# Patient Record
Sex: Female | Born: 1935 | Race: White | Hispanic: No | Smoking: Never smoker
Health system: Southern US, Community
[De-identification: ages and names within clinical notes are randomized; demographics above are authoritative.]

## PROBLEM LIST (undated history)

## (undated) DIAGNOSIS — F329 Major depressive disorder, single episode, unspecified: Secondary | ICD-10-CM

## (undated) DIAGNOSIS — K219 Gastro-esophageal reflux disease without esophagitis: Secondary | ICD-10-CM

## (undated) DIAGNOSIS — F419 Anxiety disorder, unspecified: Secondary | ICD-10-CM

## (undated) DIAGNOSIS — G4700 Insomnia, unspecified: Secondary | ICD-10-CM

## (undated) DIAGNOSIS — A419 Sepsis, unspecified organism: Secondary | ICD-10-CM

## (undated) DIAGNOSIS — R339 Retention of urine, unspecified: Secondary | ICD-10-CM

## (undated) DIAGNOSIS — R2681 Unsteadiness on feet: Secondary | ICD-10-CM

## (undated) DIAGNOSIS — F32A Depression, unspecified: Secondary | ICD-10-CM

## (undated) DIAGNOSIS — E559 Vitamin D deficiency, unspecified: Secondary | ICD-10-CM

## (undated) DIAGNOSIS — G3184 Mild cognitive impairment, so stated: Secondary | ICD-10-CM

## (undated) DIAGNOSIS — E039 Hypothyroidism, unspecified: Secondary | ICD-10-CM

## (undated) HISTORY — DX: Retention of urine, unspecified: R33.9

## (undated) HISTORY — DX: Depression, unspecified: F32.A

## (undated) HISTORY — DX: Insomnia, unspecified: G47.00

## (undated) HISTORY — DX: Gastro-esophageal reflux disease without esophagitis: K21.9

## (undated) HISTORY — DX: Unsteadiness on feet: R26.81

## (undated) HISTORY — DX: Vitamin D deficiency, unspecified: E55.9

## (undated) HISTORY — DX: Mild cognitive impairment of uncertain or unknown etiology: G31.84

## (undated) HISTORY — DX: Anxiety disorder, unspecified: F41.9

## (undated) HISTORY — DX: Major depressive disorder, single episode, unspecified: F32.9

## (undated) HISTORY — DX: Hypothyroidism, unspecified: E03.9

## (undated) HISTORY — DX: Sepsis, unspecified organism: A41.9

---

## 1979-01-23 HISTORY — PX: ABDOMINAL HYSTERECTOMY: SHX81

## 2013-07-05 ENCOUNTER — Other Ambulatory Visit: Payer: Self-pay | Admitting: *Deleted

## 2013-07-05 MED ORDER — CLONAZEPAM 1 MG PO TABS: ORAL_TABLET | ORAL | Status: DC

## 2013-07-05 NOTE — Telephone Encounter (Signed)
[facility]

## 2013-07-09 ENCOUNTER — Encounter: Payer: Self-pay | Admitting: Internal Medicine

## 2013-07-09 ENCOUNTER — Non-Acute Institutional Stay (SKILLED_NURSING_FACILITY): Payer: Medicare Other | Admitting: Internal Medicine

## 2013-07-09 DIAGNOSIS — K219 Gastro-esophageal reflux disease without esophagitis: Secondary | ICD-10-CM | POA: Insufficient documentation

## 2013-07-09 DIAGNOSIS — R2681 Unsteadiness on feet: Secondary | ICD-10-CM | POA: Insufficient documentation

## 2013-07-09 DIAGNOSIS — F3289 Other specified depressive episodes: Secondary | ICD-10-CM

## 2013-07-09 DIAGNOSIS — E559 Vitamin D deficiency, unspecified: Secondary | ICD-10-CM | POA: Insufficient documentation

## 2013-07-09 DIAGNOSIS — E039 Hypothyroidism, unspecified: Secondary | ICD-10-CM | POA: Insufficient documentation

## 2013-07-09 DIAGNOSIS — F411 Generalized anxiety disorder: Secondary | ICD-10-CM

## 2013-07-09 DIAGNOSIS — R339 Retention of urine, unspecified: Secondary | ICD-10-CM | POA: Insufficient documentation

## 2013-07-09 DIAGNOSIS — F329 Major depressive disorder, single episode, unspecified: Secondary | ICD-10-CM | POA: Insufficient documentation

## 2013-07-09 DIAGNOSIS — F32A Depression, unspecified: Secondary | ICD-10-CM | POA: Insufficient documentation

## 2013-07-09 DIAGNOSIS — G3184 Mild cognitive impairment, so stated: Secondary | ICD-10-CM

## 2013-07-09 DIAGNOSIS — R269 Unspecified abnormalities of gait and mobility: Secondary | ICD-10-CM

## 2013-07-09 DIAGNOSIS — F419 Anxiety disorder, unspecified: Secondary | ICD-10-CM | POA: Insufficient documentation

## 2013-07-09 DIAGNOSIS — R0902 Hypoxemia: Secondary | ICD-10-CM

## 2013-07-09 DIAGNOSIS — N390 Urinary tract infection, site not specified: Secondary | ICD-10-CM

## 2013-07-09 NOTE — Assessment & Plan Note (Signed)
On chronic Klonopin, felt to be part of the gait problem;will try to wean

## 2013-07-09 NOTE — Assessment & Plan Note (Signed)
Chronic issue being w/u as out pt, slowed down by recent MSSA sepsis and UTI; meds to have her normopressure hydrocephalus work up completed;tamulosin and rapaflo were d/c;wanted to d/c Klonopin but pt made a fuss; admitted OT/PT

## 2013-07-09 NOTE — Assessment & Plan Note (Signed)
D/c with 2 days of cipro to complete 7 days total

## 2013-07-09 NOTE — Assessment & Plan Note (Signed)
Etology? H and P mentions possible viral bronchits and rec nebs but d/c summary says nothing about that; think it may be OSA with rec for outpt sleep study; resolved

## 2013-07-09 NOTE — Assessment & Plan Note (Signed)
Continue effexor  

## 2013-07-09 NOTE — Assessment & Plan Note (Signed)
Unsure etiology;felt had capacity to make own decisions;pt was on sinemet for unclear reason

## 2013-07-09 NOTE — Progress Notes (Signed)
MRN: [id number] Name: Evelyn Robinson  Sex: female Age: 80 y.o. DOB: 1932-12-30  PSC #: [facility] Facility/Room: 223A Level Of Care: SNF Provider: Merrilee Seashore D Emergency Contacts: No emergency contact information on file.  Code Status: FULL  Allergies: Review of patient's allergies indicates no known allergies.  Chief Complaint  Patient presents with  . nursing home admission    HPI: Patient is 80 y.o. female who is being admitted for gait instability requiring 24 hour supervision and OT/PT.  Past Medical History  Diagnosis Date  . Anxiety   . Depression   . GERD (gastroesophageal reflux disease)   . Vitamin D deficiency disease   . Hypothyroid   . Urinary retention   . Insomnia   . Mild cognitive impairment   . Gait instability     Past Surgical History  Procedure Laterality Date  . Abdominal hysterectomy  1980's      Medication List       This list is accurate as of: 07/09/13  9:43 PM.  Always use your most recent med list.               carbidopa-levodopa 25-100 MG per tablet  Commonly known as:  SINEMET IR  Take 1 tablet by mouth 2 (two) times daily.     clonazePAM 1 MG tablet  Commonly known as:  KLONOPIN  Take one and 1/2 tablet by mouth at bedtime     levothyroxine 100 MCG tablet  Commonly known as:  SYNTHROID, LEVOTHROID  Take 100 mcg by mouth daily before breakfast.     omeprazole 20 MG capsule  Commonly known as:  PRILOSEC  Take 20 mg by mouth 2 (two) times daily before a meal.     polyethylene glycol packet  Commonly known as:  MIRALAX / GLYCOLAX  Take 17 g by mouth 2 (two) times daily as needed.     venlafaxine XR 37.5 MG 24 hr capsule  Commonly known as:  EFFEXOR-XR  Take 37.5 mg by mouth daily with breakfast.        Meds ordered this encounter  Medications  . carbidopa-levodopa (SINEMET IR) 25-100 MG per tablet    Sig: Take 1 tablet by mouth 2 (two) times daily.  Marland Kitchen levothyroxine (SYNTHROID, LEVOTHROID) 100 MCG tablet     Sig: Take 100 mcg by mouth daily before breakfast.  . omeprazole (PRILOSEC) 20 MG capsule    Sig: Take 20 mg by mouth 2 (two) times daily before a meal.  . polyethylene glycol (MIRALAX / GLYCOLAX) packet    Sig: Take 17 g by mouth 2 (two) times daily as needed.  . venlafaxine XR (EFFEXOR-XR) 37.5 MG 24 hr capsule    Sig: Take 37.5 mg by mouth daily with breakfast.     There is no immunization history on file for this patient.  History  Substance Use Topics  . Smoking status: Unknown If Ever Smoked  . Smokeless tobacco: Not on file  . Alcohol Use: Not on file    Family history is noncontributory    Review of Systems  DATA OBTAINED: from patient GENERAL: Feels well no fevers, fatigue, appetite changes SKIN: No itching, rash or wounds EYES: No eye pain, redness, discharge EARS: No earache, tinnitus, change in hearing NOSE: No congestion, drainage or bleeding  MOUTH/THROAT: No mouth or tooth pain, No sore throat, No difficulty chewing or swallowing  RESPIRATORY: No cough, wheezing, SOB CARDIAC: No chest pain, palpitations, lower extremity edema  GI: No abdominal pain, No  N/V/D or constipation, No heartburn or reflux  GU: No dysuria, frequency or urgency, or incontinence  MUSCULOSKELETAL: No unrelieved bone/joint pain NEUROLOGIC: No headache, dizziness or focal weakness PSYCHIATRIC: No overt anxiety or sadness. Sleeps well. No behavior issue.   Filed Vitals:   07/09/13 2055  BP: 150/88  Pulse: 71  Temp: 97.6 F (36.4 C)  Resp: 18    Physical Exam  GENERAL APPEARANCE: Alert, conversant. Appropriately groomed. No acute distress.  SKIN: No diaphoresis HEAD: Normocephalic, atraumatic  EYES: Conjunctiva/lids clear. Pupils round, reactive. EOMs intact.  EARS: External exam WNL, canals clear. Hearing grossly normal.  NOSE: No deformity or discharge.  MOUTH/THROAT: Lips w/o lesions.  RESPIRATORY: Breathing is even, unlabored. Lung sounds are clear   CARDIOVASCULAR:  Heart RRR no murmurs, rubs or gallops. No peripheral edema.   GASTROINTESTINAL: Abdomen is soft, non-tender, not distended w/ normal bowel sounds. GENITOURINARY: Bladder non tender, not distended  MUSCULOSKELETAL: No abnormal joints or musculature NEUROLOGIC: Oriented X3. Cranial nerves 2-12 grossly intact. Moves all extremities no tremor. PSYCHIATRIC: Mood and affect appropriate to situation, no behavioral issues  Patient Active Problem List   Diagnosis Date Noted  . UTI (urinary tract infection) 07/09/2013  . Hypoxia 07/09/2013  . Anxiety   . Depression   . GERD (gastroesophageal reflux disease)   . Vitamin D deficiency disease   . Hypothyroid   . Urinary retention   . Gait instability   . Mild cognitive impairment        Assessment and Plan  Gait instability Chronic issue being w/u as out pt, slowed down by recent MSSA sepsis and UTI; meds to have her normopressure hydrocephalus work up completed;tamulosin and rapaflo were d/c;wanted to d/c Klonopin but pt made a fuss; admitted OT/PT  UTI (urinary tract infection) D/c with 2 days of cipro to complete 7 days total  Mild cognitive impairment Unsure etiology;felt had capacity to make own decisions;pt was on sinemet for unclear reason  Depression Continue effexor  Anxiety On chronic Klonopin, felt to be part of the gait problem;will try to wean  Hypoxia Etology? H and P mentions possible viral bronchits and rec nebs but d/c summary says nothing about that; think it may be OSA with rec for outpt sleep study; resolved    Margit HanksALEXANDER, Shaquavia Whisonant D, MD

## 2013-07-20 ENCOUNTER — Non-Acute Institutional Stay (SKILLED_NURSING_FACILITY): Payer: Medicare Other | Admitting: Nurse Practitioner

## 2013-07-20 DIAGNOSIS — R269 Unspecified abnormalities of gait and mobility: Secondary | ICD-10-CM

## 2013-07-20 DIAGNOSIS — E039 Hypothyroidism, unspecified: Secondary | ICD-10-CM

## 2013-07-20 DIAGNOSIS — R0902 Hypoxemia: Secondary | ICD-10-CM

## 2013-07-20 DIAGNOSIS — K219 Gastro-esophageal reflux disease without esophagitis: Secondary | ICD-10-CM

## 2013-07-20 DIAGNOSIS — N390 Urinary tract infection, site not specified: Secondary | ICD-10-CM

## 2013-07-20 DIAGNOSIS — F411 Generalized anxiety disorder: Secondary | ICD-10-CM

## 2013-07-20 DIAGNOSIS — R2681 Unsteadiness on feet: Secondary | ICD-10-CM

## 2013-07-20 DIAGNOSIS — F419 Anxiety disorder, unspecified: Secondary | ICD-10-CM

## 2013-07-20 NOTE — Progress Notes (Signed)
Patient ID: Evelyn Robinson, female   DOB: 10-18-1932, 80 y.o.   MRN: [id number]    Nursing Home Location:  [facility]   Place of Service: SNF (31)  PCP: No primary provider on file.  No Known Allergies  Chief Complaint  Patient presents with  . Discharge Note    HPI:  80 year old female who [facility] hospitalization for UTI, hypoxia, gait instability for rehab. Pt with pmh of anxiety depression, GERD, hypothyroidism. Pt was worked up in the hospital due to frequent falls but still needs to have ongoing evaluation for normal pressure hydrocephalus per hospital discharge report. Patient currently doing well with therapy, now stable to discharge home with home health.  Review of Systems:  Review of Systems  Constitutional: Negative for fever, chills and malaise/fatigue.  Eyes: Negative.   Respiratory: Negative for cough and shortness of breath.   Cardiovascular: Negative for chest pain and palpitations.  Gastrointestinal: Positive for constipation. Negative for heartburn, abdominal pain and diarrhea.  Genitourinary: Negative for dysuria and urgency.  Musculoskeletal: Negative for falls, joint pain and myalgias.  Skin: Negative.   Neurological: Negative for dizziness, sensory change, focal weakness, weakness and headaches.  Psychiatric/Behavioral: Negative for depression. The patient is not nervous/anxious and does not have insomnia.      Past Medical History  Diagnosis Date  . Anxiety   . Depression   . GERD (gastroesophageal reflux disease)   . Vitamin D deficiency disease   . Hypothyroid   . Urinary retention   . Insomnia   . Mild cognitive impairment   . Gait instability    Past Surgical History  Procedure Laterality Date  . Abdominal hysterectomy  1980's   Social History:   has no tobacco, alcohol, and drug history on file.  No family history on file.  Medications: Patient's Medications  New Prescriptions   No  medications on file  Previous Medications   CARBIDOPA-LEVODOPA (SINEMET IR) 25-100 MG PER TABLET    Take 1 tablet by mouth 2 (two) times daily.   CLONAZEPAM (KLONOPIN) 1 MG TABLET    Take one and 1/2 tablet by mouth at bedtime   LEVOTHYROXINE (SYNTHROID, LEVOTHROID) 100 MCG TABLET    Take 100 mcg by mouth daily before breakfast.   OMEPRAZOLE (PRILOSEC) 20 MG CAPSULE    Take 20 mg by mouth 2 (two) times daily before a meal.   POLYETHYLENE GLYCOL (MIRALAX / GLYCOLAX) PACKET    Take 17 g by mouth 2 (two) times daily as needed.   VENLAFAXINE XR (EFFEXOR-XR) 37.5 MG 24 HR CAPSULE    Take 37.5 mg by mouth daily with breakfast.  Modified Medications   No medications on file  Discontinued Medications   No medications on file     Physical Exam:  Filed Vitals:   07/20/13 1410  BP: 139/80  Pulse: 76  Temp: 97.4 F (36.3 C)  Resp: 20  SpO2: 97%   Physical Exam  Constitutional: She is oriented to person, place, and time and well-developed, well-nourished, and in no distress.  HENT:  Head: Normocephalic and atraumatic.  Mouth/Throat: Oropharynx is clear and moist. No oropharyngeal exudate.  Eyes: Conjunctivae and EOM are normal. Pupils are equal, round, and reactive to light.  Neck: Normal range of motion. Neck supple.  Cardiovascular: Normal rate, regular rhythm and normal heart sounds.   Pulmonary/Chest: Effort normal and breath sounds normal. No respiratory distress.  Abdominal: Soft. Bowel sounds are normal. She exhibits no  distension.  Musculoskeletal: Normal range of motion. She exhibits no edema and no tenderness.  Neurological: She is alert and oriented to person, place, and time. Gait normal.  Skin: Skin is warm and dry.  Psychiatric: Affect normal.       Assessment/Plan 1. Hypoxia -this has resolved; pt was thought to have OSA but needs outpt follow up  2. GERD (gastroesophageal reflux disease) -without complaints on omeprazole 20 mg BID   3. Hypothyroid -on  levothroxine daily; no TSH on file, will need to be followed by outpt PCP  4. UTI (urinary tract infection) -resolved, finished Cipro; no symptoms   5. Gait instability -multiple falls at home, has done well at [facility] pt is stable for discharge-will need PT/OT per home health. No DME needed. Rx written.  will need to follow up with PCP within 2 weeks.  -will need ongoing follow up with neurology  For normal pressure hydrocephalus work up which was not completed in hospital   6. Anxiety -currently stable on Effexor and klonopin   7.Constipation - Will have occasional constipation -recommended colace 100 mg daily and to increase water intake

## 2013-08-16 ENCOUNTER — Ambulatory Visit: Payer: Medicare Other | Admitting: Nurse Practitioner

## 2013-08-21 DIAGNOSIS — R269 Unspecified abnormalities of gait and mobility: Secondary | ICD-10-CM

## 2013-08-21 DIAGNOSIS — Z5189 Encounter for other specified aftercare: Secondary | ICD-10-CM

## 2013-08-21 DIAGNOSIS — N390 Urinary tract infection, site not specified: Secondary | ICD-10-CM

## 2013-09-06 ENCOUNTER — Ambulatory Visit (INDEPENDENT_AMBULATORY_CARE_PROVIDER_SITE_OTHER): Payer: Medicare Other | Admitting: Nurse Practitioner

## 2013-09-06 ENCOUNTER — Encounter: Payer: Self-pay | Admitting: Nurse Practitioner

## 2013-09-06 VITALS — BP 120/64 | HR 68 | Temp 97.8°F | Ht 66.0 in | Wt 205.0 lb

## 2013-09-06 DIAGNOSIS — R0902 Hypoxemia: Secondary | ICD-10-CM

## 2013-09-06 DIAGNOSIS — K5900 Constipation, unspecified: Secondary | ICD-10-CM | POA: Insufficient documentation

## 2013-09-06 DIAGNOSIS — K219 Gastro-esophageal reflux disease without esophagitis: Secondary | ICD-10-CM

## 2013-09-06 DIAGNOSIS — F419 Anxiety disorder, unspecified: Secondary | ICD-10-CM

## 2013-09-06 DIAGNOSIS — G912 (Idiopathic) normal pressure hydrocephalus: Secondary | ICD-10-CM

## 2013-09-06 DIAGNOSIS — E039 Hypothyroidism, unspecified: Secondary | ICD-10-CM

## 2013-09-06 DIAGNOSIS — F411 Generalized anxiety disorder: Secondary | ICD-10-CM

## 2013-09-06 NOTE — Patient Instructions (Addendum)
May decrease omeprazole to once daily if not having any acid reflux-- if this gets worse can increase back to twice daily  Will have you come back in 4 weeks for extended visit with fasting blood work prior to visit   Referrals done for neurology and pulmonary -please call and have your records sent to neurologist  For arm pain May use heat Aleve twice daily for 1 week Will make Occupational therapy aware to help

## 2013-09-06 NOTE — Progress Notes (Signed)
Patient ID: Evelyn Robinson, female   DOB: Feb 15, 1933, 80 y.o.   MRN: [id number]    No Known Allergies  Chief Complaint  Patient presents with  . Establish Care    NP-Establish Care  . other    recovering from sepsis & weakness is getting better, Rt shoulder pain from falling during this time.  . Immunizations    waiting on old medical records    HPI: Patient is a 80 y.o. female seen in the office today to establish care, [place] and was [place] there then transferred up to [place] to live close to her daughter, living at [address].  Was in the process of being evaluated for NPH by neurology (unsure of who this was) but was hospitalized before workup was done  [facility], going well.  -when she was at home in [place] she feel on her left shoulder, soreness is still there after 3-4 months, never had it evaluated when she was in [place]. Pain from elbow to the shoulder. Decreased strength and pain when she tries to use arm Had original injury months ago then another episodes where she fell 3 weeks ago   Review of Systems:  Review of Systems  Constitutional: Positive for malaise/fatigue (baseline has decreae energy). Negative for weight loss.  HENT: Positive for congestion (non allergic sinusitis- chronic no medication). Negative for hearing loss and tinnitus.   Eyes:       Wears corrective lens  Respiratory: Negative for cough, sputum production and shortness of breath.   Cardiovascular: Negative for chest pain and leg swelling.  Gastrointestinal: Negative for heartburn, diarrhea and constipation.  Genitourinary: Negative for dysuria, urgency and frequency.       Incontinence of urine  Musculoskeletal: Positive for joint pain (right shoulder) and myalgias (right upper arm).  Neurological: Negative for dizziness, tingling, tremors, focal weakness, weakness and headaches.  Psychiatric/Behavioral: Positive for depression and memory  loss (neurologist evaluated). The patient is nervous/anxious and has insomnia (medication helps).      Past Medical History  Diagnosis Date  . Anxiety   . Depression   . GERD (gastroesophageal reflux disease)   . Vitamin D deficiency disease   . Hypothyroid   . Urinary retention   . Insomnia   . Mild cognitive impairment   . Gait instability   . Sepsis    Past Surgical History  Procedure Laterality Date  . Abdominal hysterectomy  1980's   Social History:   reports that she has never smoked. She does not have any smokeless tobacco history on file. She reports that she drinks alcohol. She reports that she does not use illicit drugs.  History reviewed. No pertinent family history.  Medications: Patient's Medications  New Prescriptions   No medications on file  Previous Medications   CARBIDOPA-LEVODOPA (SINEMET IR) 25-100 MG PER TABLET    Take 1 tablet by mouth 2 (two) times daily.   CLONAZEPAM (KLONOPIN) 1 MG TABLET    Take one and 1/2 tablet by mouth at bedtime   LEVOTHYROXINE (SYNTHROID, LEVOTHROID) 100 MCG TABLET    Take 100 mcg by mouth daily before breakfast. For Hypothyroidism   OMEPRAZOLE (PRILOSEC) 20 MG CAPSULE    Take 20 mg by mouth 2 (two) times daily before a meal.   POLYETHYLENE GLYCOL (MIRALAX / GLYCOLAX) PACKET    Take 17 g by mouth 2 (two) times daily as needed. For constipation   VENLAFAXINE XR (EFFEXOR-XR) 37.5 MG 24 HR CAPSULE  Take 37.5 mg by mouth daily with breakfast.  Modified Medications   No medications on file  Discontinued Medications   No medications on file     Physical Exam:  Filed Vitals:   09/06/13 1358  BP: 120/64  Pulse: 68  Temp: 97.8 F (36.6 C)  TempSrc: Oral  Height: 5\' 6"  (1.676 m)  Weight: 205 lb (92.987 kg)  SpO2: 95%   Physical Exam  Constitutional: She is oriented to person, place, and time and well-developed, well-nourished, and in no distress.  HENT:  Head: Normocephalic and atraumatic.  Mouth/Throat: Oropharynx  is clear and moist. No oropharyngeal exudate.  Eyes: Conjunctivae and EOM are normal. Pupils are equal, round, and reactive to light.  Neck: Normal range of motion. Neck supple.  Cardiovascular: Normal rate, regular rhythm and normal heart sounds.   Pulmonary/Chest: Effort normal and breath sounds normal. No respiratory distress.  Abdominal: Soft. Bowel sounds are normal. She exhibits no distension.  Musculoskeletal: Normal range of motion. She exhibits no edema and no tenderness.  Neurological: She is alert and oriented to person, place, and time.  Skin: Skin is warm and dry.  Psychiatric: Affect normal.     Assessment/Plan  1. Hypothyroid -conts on synthroid, no recent TSH - TSH; before next appt  2. GERD (gastroesophageal reflux disease) -stable on prolosec, may decrease down to once daily since not having symptoms   3. Normal pressure hydrocephalus -had started work up [place], will have daughter get pts records transferred to local neurology office as well  - Ambulatory referral to Neurology - CBC With differential/Platelet; Future - Lipid panel; Future - Comprehensive metabolic panel; Future  4. Anxiety -remains stable on effexor and klonopin   5. Unspecified constipation -improved since at home  6. Hypoxia -noted in recent hospitalization during sleep, outpt sleep apnea follow up was recommended  - Ambulatory referral to Pulmonology  7. Right shoulder/arm pain -normal strength and ROM on exam; pt and daughter instructed she may use heat as needed, aleve twice daily for 1 week for inflammation, and will make Occupational therapy aware to evaluate and treat   Follow up in 4 weeks for EV with fasting blood work prior to visit, will get MMSE

## 2013-09-13 ENCOUNTER — Other Ambulatory Visit: Payer: Self-pay | Admitting: Nurse Practitioner

## 2013-09-18 ENCOUNTER — Ambulatory Visit: Payer: Medicare Other | Admitting: Diagnostic Neuroimaging

## 2013-09-20 DIAGNOSIS — F329 Major depressive disorder, single episode, unspecified: Secondary | ICD-10-CM | POA: Insufficient documentation

## 2013-09-20 DIAGNOSIS — R262 Difficulty in walking, not elsewhere classified: Secondary | ICD-10-CM | POA: Insufficient documentation

## 2013-09-20 DIAGNOSIS — K219 Gastro-esophageal reflux disease without esophagitis: Secondary | ICD-10-CM | POA: Insufficient documentation

## 2013-09-20 DIAGNOSIS — Z8669 Personal history of other diseases of the nervous system and sense organs: Secondary | ICD-10-CM | POA: Insufficient documentation

## 2013-09-20 DIAGNOSIS — R42 Dizziness and giddiness: Secondary | ICD-10-CM | POA: Insufficient documentation

## 2013-09-20 DIAGNOSIS — F411 Generalized anxiety disorder: Secondary | ICD-10-CM | POA: Insufficient documentation

## 2013-09-20 DIAGNOSIS — F3289 Other specified depressive episodes: Secondary | ICD-10-CM | POA: Insufficient documentation

## 2013-09-20 DIAGNOSIS — N390 Urinary tract infection, site not specified: Secondary | ICD-10-CM | POA: Insufficient documentation

## 2013-09-20 DIAGNOSIS — E039 Hypothyroidism, unspecified: Secondary | ICD-10-CM | POA: Insufficient documentation

## 2013-09-20 DIAGNOSIS — Z79899 Other long term (current) drug therapy: Secondary | ICD-10-CM | POA: Insufficient documentation

## 2013-09-21 ENCOUNTER — Encounter (HOSPITAL_COMMUNITY): Payer: Self-pay | Admitting: Emergency Medicine

## 2013-09-21 ENCOUNTER — Emergency Department (HOSPITAL_COMMUNITY): Payer: Medicare Other

## 2013-09-21 ENCOUNTER — Emergency Department (HOSPITAL_COMMUNITY)
Admission: EM | Admit: 2013-09-21 | Discharge: 2013-09-21 | Disposition: A | Discharge status: Home / Self Care | Payer: Medicare Other | Attending: Emergency Medicine | Admitting: Emergency Medicine

## 2013-09-21 DIAGNOSIS — R42 Dizziness and giddiness: Secondary | ICD-10-CM

## 2013-09-21 DIAGNOSIS — N390 Urinary tract infection, site not specified: Secondary | ICD-10-CM

## 2013-09-21 LAB — URINALYSIS, ROUTINE W REFLEX MICROSCOPIC
Bilirubin Urine: NEGATIVE
GLUCOSE, UA: NEGATIVE mg/dL
Hgb urine dipstick: NEGATIVE
KETONES UR: 15 mg/dL — AB
NITRITE: POSITIVE — AB
PROTEIN: NEGATIVE mg/dL
Specific Gravity, Urine: 1.029 (ref 1.005–1.030)
UROBILINOGEN UA: 0.2 mg/dL (ref 0.0–1.0)
pH: 6 (ref 5.0–8.0)

## 2013-09-21 LAB — CBC
HCT: 38.7 % (ref 36.0–46.0)
Hemoglobin: 12.8 g/dL (ref 12.0–15.0)
MCH: 29 pg (ref 26.0–34.0)
MCHC: 33.1 g/dL (ref 30.0–36.0)
MCV: 87.6 fL (ref 78.0–100.0)
Platelets: 318 10*3/uL (ref 150–400)
RBC: 4.42 MIL/uL (ref 3.87–5.11)
RDW: 15 % (ref 11.5–15.5)
WBC: 8.6 10*3/uL (ref 4.0–10.5)

## 2013-09-21 LAB — BASIC METABOLIC PANEL
BUN: 24 mg/dL — AB (ref 6–23)
CHLORIDE: 103 meq/L (ref 96–112)
CO2: 23 mEq/L (ref 19–32)
Calcium: 9.1 mg/dL (ref 8.4–10.5)
Creatinine, Ser: 0.71 mg/dL (ref 0.50–1.10)
GFR, EST NON AFRICAN AMERICAN: 80 mL/min — AB (ref >90)
Glucose, Bld: 132 mg/dL — ABNORMAL HIGH (ref 70–99)
POTASSIUM: 3.8 meq/L (ref 3.7–5.3)
SODIUM: 142 meq/L (ref 137–147)

## 2013-09-21 LAB — URINE MICROSCOPIC-ADD ON

## 2013-09-21 LAB — I-STAT CG4 LACTIC ACID, ED: LACTIC ACID, VENOUS: 1.3 mmol/L (ref 0.5–2.2)

## 2013-09-21 MED ORDER — SULFAMETHOXAZOLE-TMP DS 800-160 MG PO TABS
1.0000 | ORAL_TABLET | Freq: Once | ORAL | Status: AC
  Administered 2013-09-21: 1 via ORAL
  Filled 2013-09-21: qty 1

## 2013-09-21 MED ORDER — SULFAMETHOXAZOLE-TRIMETHOPRIM 800-160 MG PO TABS: 1.0000 | ORAL_TABLET | Freq: Two times a day (BID) | ORAL | Status: DC

## 2013-09-21 NOTE — ED Notes (Signed)
Patient arrives tonight with complaint of "stumbling" and dizziness when "turning head quickly". Was treated for sepsis in December and remembers feeling some of the same symptoms at that time. Has been in recovery since then. Currently has home health nurses that visit to help take care of her. Today nurse noticed that patient wasn't doing as well as normal with ambulation, standing, and balance.

## 2013-09-21 NOTE — ED Notes (Signed)
Returned from CT.

## 2013-09-21 NOTE — ED Provider Notes (Signed)
CSN: [id number]     Arrival date & time 09/20/13  2356 History   First MD Initiated Contact with Patient 09/21/13 0305     Chief Complaint  Patient presents with  . Difficulty Walking  . Dizziness     (Consider location/radiation/quality/duration/timing/severity/associated sxs/prior Treatment) HPI 80 year old female presents to emergency department from her assisted living facility with complaint of concern for sepsis.  Patient reports that she was diagnosed with sepsis in December, and afterwards developed problems with balance and and stability as well as generalized weakness.  Patient had a stay in a rehabilitation hospital and then a nursing facility, and is now moved up to a assisted living facility.  She has a physical therapist who comes twice a week to help her with strengthening and her balance.  Patient reports over the last 2-3 days she has felt slightly more dizzy in the morning when she wakes up especially after rolling over.  Symptoms last for several hours and are usually better in the afternoon.  Patient attributes her symptoms to taking extra Klonopin.  Patient reports dizziness feels like too much medication.  She denies presyncopal feeling.  Dizziness is a spinning sensation.  She reports today she has felt better as far as her dizziness is concerned.  Her physical therapist saw her today, and was concerned about her gait and dizziness.  The physical therapist told the patient that sometimes sepsis came back, which concerned the patient.  Patient denies any fever or chills no nausea vomiting or cough no urinary symptoms.  She is not quite sure what prompted her sepsis in December.  She is concerned as she was told that she progressed quickly to her sepsis in December that she may be developing sepsis and was told all was to come to the hospital immediately when she was feeling bad. Past Medical History  Diagnosis Date  . Anxiety   . Depression   . GERD (gastroesophageal reflux  disease)   . Vitamin D deficiency disease   . Hypothyroid   . Urinary retention   . Insomnia   . Mild cognitive impairment   . Gait instability   . Sepsis    Past Surgical History  Procedure Laterality Date  . Abdominal hysterectomy  1980's   History reviewed. No pertinent family history. History  Substance Use Topics  . Smoking status: Never Smoker   . Smokeless tobacco: Not on file  . Alcohol Use: Yes     Comment: maybe wine at christmas    OB History   Grav Para Term Preterm Abortions TAB SAB Ect Mult Living                 Review of Systems  See History of Present Illness; otherwise all other systems are reviewed and negative   Allergies  Review of patient's allergies indicates no known allergies.  Home Medications   Prior to Admission medications   Medication Sig Start Date End Date Taking? Authorizing Provider  carbidopa-levodopa (SINEMET IR) 25-100 MG per tablet TAKE ONE TABLET BY MOUTH TWICE DAILY 09/13/13   Claudie ReveringJessica M Karam, NP  clonazePAM (KLONOPIN) 1 MG tablet TAKE ONE & ONE-HALF TABLETS BY MOUTH AT BEDTIME 09/13/13   Claudie ReveringJessica M Karam, NP  levothyroxine (SYNTHROID, LEVOTHROID) 100 MCG tablet TAKE ONE TABLET BY MOUTH ONCE DAILY 09/13/13   Claudie ReveringJessica M Karam, NP  omeprazole (PRILOSEC) 20 MG capsule Take 1 capsule by mouth daily for Acid Reflex 09/13/13   Claudie ReveringJessica M Karam, NP  polyethylene glycol [facility](MIRALAX /  GLYCOLAX) packet Take 17 g by mouth 2 (two) times daily as needed. For constipation    Historical Provider, MD  venlafaxine XR (EFFEXOR-XR) 37.5 MG 24 hr capsule TAKE ONE CAPSULE BY MOUTH ONCE DAILY FOR ANXIETY/ DEPRESSION. 09/13/13   Claudie ReveringJessica M Karam, NP   BP 132/52  Pulse 73  Temp(Src) 97.6 F (36.4 C) (Oral)  Resp 18  Ht 5\' 7"  (1.702 m)  Wt 204 lb (92.534 kg)  BMI 31.94 kg/m2  SpO2 97% Physical Exam  Nursing note and vitals reviewed. Constitutional: She is oriented to person, place, and time. She appears well-developed and well-nourished.  HENT:  Head:  Normocephalic and atraumatic.  Right Ear: External ear normal.  Left Ear: External ear normal.  Nose: Nose normal.  Mouth/Throat: Oropharynx is clear and moist.  Eyes: Conjunctivae and EOM are normal. Pupils are equal, round, and reactive to light.  Neck: Normal range of motion. Neck supple. No JVD present. No tracheal deviation present. No thyromegaly present.  Cardiovascular: Normal rate, regular rhythm, normal heart sounds and intact distal pulses.  Exam reveals no gallop and no friction rub.   No murmur heard. Pulmonary/Chest: Effort normal and breath sounds normal. No stridor. No respiratory distress. She has no wheezes. She has no rales. She exhibits no tenderness.  Abdominal: Soft. Bowel sounds are normal. She exhibits no distension and no mass. There is no tenderness. There is no rebound and no guarding.  Musculoskeletal: Normal range of motion. She exhibits no edema and no tenderness.  Lymphadenopathy:    She has no cervical adenopathy.  Neurological: She is alert and oriented to person, place, and time. She has normal reflexes. No cranial nerve deficit. She exhibits normal muscle tone. Coordination (mild difficulty with heel shin and finger-nose finger) abnormal.  Skin: Skin is warm and dry. No rash noted. No erythema. No pallor.  Psychiatric: She has a normal mood and affect. Her behavior is normal. Judgment and thought content normal.    ED Course  Procedures (including critical care time) Labs Review Labs Reviewed  BASIC METABOLIC PANEL - Abnormal; Notable for the following:    Glucose, Bld 132 (*)    BUN 24 (*)    GFR calc non Af Amer 80 (*)    All other components within normal limits  URINALYSIS, ROUTINE W REFLEX MICROSCOPIC - Abnormal; Notable for the following:    APPearance TURBID (*)    Ketones, ur 15 (*)    Nitrite POSITIVE (*)    Leukocytes, UA MODERATE (*)    All other components within normal limits  URINE MICROSCOPIC-ADD ON - Abnormal; Notable for the  following:    Squamous Epithelial / LPF FEW (*)    Bacteria, UA MANY (*)    All other components within normal limits  CBC  I-STAT CG4 LACTIC ACID, ED    Imaging Review Ct Head Wo Contrast  09/21/2013   CLINICAL DATA:  Dizziness, headache  EXAM: CT HEAD WITHOUT CONTRAST  TECHNIQUE: Contiguous axial images were obtained from the base of the skull through the vertex without intravenous contrast.  COMPARISON:  None.  FINDINGS: Mild diffuse age-related atrophy noted. Scattered and confluent hypodensity within the periventricular and deep white matter of the cerebral hemispheres is consistent with chronic small vessel ischemic disease.  No acute intracranial hemorrhage or large vessel territory infarct identified. No mass or midline shift. There is no hydrocephalus. Mild asymmetric prominence of the left lateral ventricle as compared to the right noted. Basal ganglia calcifications noted. No extra-axial  fluid collection.  The calvarium is intact. Orbits are within normal limits. Scalp soft tissues are unremarkable.  Paranasal sinuses and mastoid air cells are well pneumatized and free of fluid.  IMPRESSION: 1. No acute intracranial abnormality. 2. Mild generalized cerebral atrophy with moderate chronic microvascular ischemic disease.   Electronically Signed   By: Rise Mu M.D.   On: 09/21/2013 05:21     EKG Interpretation None      MDM   Final diagnoses:  Dizziness  Urinary tract infection   80 year old female with acute on chronic dizziness, and concern for sepsis.  Patient's neuro exam is overall normal though she does have some problems with mild coordination, finger-nose-finger and heel shin slightly off.  There is documentation of normal pressure hydrocephalus, but no prior imaging in our system.  We'll start with a CAT scan of the head.  We'll get baseline labs.  Patient has close followup with the neurologist next week.  Symptoms have been present for 3 days.  The possibility of  the posterior cerebellar stroke is present, but less likely, and as symptoms have been present for 3 days and actually improving, not a candidate for TPA or aggressive inpatient rehabilitation.  Patient is more concerned about sepsis, no signs on physical exam, patient is mentating, normal vitals.  7:26 AM Patient noted to have UTI, will treat for same, have patient followup with neurology and her primary care Dr.  Olivia Mackie, MD 09/21/13 [phone]

## 2013-09-21 NOTE — ED Notes (Signed)
Patient transported to CT 

## 2013-09-21 NOTE — ED Notes (Signed)
MD at bedside. 

## 2013-09-21 NOTE — Discharge Instructions (Signed)
Your workup has shown NO SIGNS OF SEPSIS!  You do have a mild urinary tract infection, and should start the antibiotic prescribed.  Your ongoing dizziness is concerning, and you should avoid use of your klonopin as this might worsen your symptoms.  Drink plenty of water!  Keep your appointment with the neurologist next week. Return to the ER for worsening condition or new concerns.   Dizziness Dizziness is a common problem. It is a feeling of unsteadiness or lightheadedness. You may feel like you are about to faint. Dizziness can lead to injury if you stumble or fall. A person of any age group can suffer from dizziness, but dizziness is more common in older adults. CAUSES  Dizziness can be caused by many different things, including:  Middle ear problems.  Standing for too long.  Infections.  An allergic reaction.  Aging.  An emotional response to something, such as the sight of blood.  Side effects of medicines.  Fatigue.  Problems with circulation or blood pressure.  Excess use of alcohol, medicines, or illegal drug use.  Breathing too fast (hyperventilation).  An arrhythmia or problems with your heart rhythm.  Low red blood cell count (anemia).  Pregnancy.  Vomiting, diarrhea, fever, or other illnesses that cause dehydration.  Diseases or conditions such as Parkinson's disease, high blood pressure (hypertension), diabetes, and thyroid problems.  Exposure to extreme heat. DIAGNOSIS  To find the cause of your dizziness, your caregiver may do a physical exam, lab tests, radiologic imaging scans, or an electrocardiography test (ECG).  TREATMENT  Treatment of dizziness depends on the cause of your symptoms and can vary greatly. HOME CARE INSTRUCTIONS   Drink enough fluids to keep your urine clear or pale yellow. This is especially important in very hot weather. In the elderly, it is also important in cold weather.  If your dizziness is caused by medicines, take them  exactly as directed. When taking blood pressure medicines, it is especially important to get up slowly.  Rise slowly from chairs and steady yourself until you feel okay.  In the morning, first sit up on the side of the bed. When this seems okay, stand slowly while holding onto something until you know your balance is fine.  If you need to stand in one place for a long time, be sure to move your legs often. Tighten and relax the muscles in your legs while standing.  If dizziness continues to be a problem, have someone stay with you for a day or two. Do this until you feel you are well enough to stay alone. Have the person call your caregiver if he or she notices changes in you that are concerning.  Do not drive or use heavy machinery if you feel dizzy.  Do not drink alcohol. SEEK IMMEDIATE MEDICAL CARE IF:   Your dizziness or lightheadedness gets worse.  You feel nauseous or vomit.  You develop problems with talking, walking, weakness, or using your arms, hands, or legs.  You are not thinking clearly or you have difficulty forming sentences. It may take a friend or family member to determine if your thinking is normal.  You develop chest pain, abdominal pain, shortness of breath, or sweating.  Your vision changes.  You notice any bleeding.  You have side effects from medicine that seems to be getting worse rather than better. MAKE SURE YOU:   Understand these instructions.  Will watch your condition.  Will get help right away if you are not doing  well or get worse. Document Released: 11/03/2000 Document Revised: 08/02/2011 Document Reviewed: 11/27/2010 [facility] Patient Information 2014 [place], [place].   Urinary Tract Infection Urinary tract infections (UTIs) can develop anywhere along your urinary tract. Your urinary tract is your body's drainage system for removing wastes and extra water. Your urinary tract includes two kidneys, two ureters, a bladder, and a urethra. Your  kidneys are a pair of bean-shaped organs. Each kidney is about the size of your fist. They are located below your ribs, one on each side of your spine. CAUSES Infections are caused by microbes, which are microscopic organisms, including fungi, viruses, and bacteria. These organisms are so small that they can only be seen through a microscope. Bacteria are the microbes that most commonly cause UTIs. SYMPTOMS  Symptoms of UTIs may vary by age and gender of the patient and by the location of the infection. Symptoms in young women typically include a frequent and intense urge to urinate and a painful, burning feeling in the bladder or urethra during urination. Older women and men are more likely to be tired, shaky, and weak and have muscle aches and abdominal pain. A fever may mean the infection is in your kidneys. Other symptoms of a kidney infection include pain in your back or sides below the ribs, nausea, and vomiting. DIAGNOSIS To diagnose a UTI, your caregiver will ask you about your symptoms. Your caregiver also will ask to provide a urine sample. The urine sample will be tested for bacteria and white blood cells. White blood cells are made by your body to help fight infection. TREATMENT  Typically, UTIs can be treated with medication. Because most UTIs are caused by a bacterial infection, they usually can be treated with the use of antibiotics. The choice of antibiotic and length of treatment depend on your symptoms and the type of bacteria causing your infection. HOME CARE INSTRUCTIONS  If you were prescribed antibiotics, take them exactly as your caregiver instructs you. Finish the medication even if you feel better after you have only taken some of the medication.  Drink enough water and fluids to keep your urine clear or pale yellow.  Avoid caffeine, tea, and carbonated beverages. They tend to irritate your bladder.  Empty your bladder often. Avoid holding urine for long periods of  time.  Empty your bladder before and after sexual intercourse.  After a bowel movement, women should cleanse from front to back. Use each tissue only once. SEEK MEDICAL CARE IF:   You have back pain.  You develop a fever.  Your symptoms do not begin to resolve within 3 days. SEEK IMMEDIATE MEDICAL CARE IF:   You have severe back pain or lower abdominal pain.  You develop chills.  You have nausea or vomiting.  You have continued burning or discomfort with urination. MAKE SURE YOU:   Understand these instructions.  Will watch your condition.  Will get help right away if you are not doing well or get worse. Document Released: 02/17/2005 Document Revised: 11/09/2011 Document Reviewed: 06/18/2011 [facility]Exit[facility] Patient Information 2014 [place] CenterExitCare, [place].

## 2013-09-24 ENCOUNTER — Telehealth (HOSPITAL_BASED_OUTPATIENT_CLINIC_OR_DEPARTMENT_OTHER): Payer: Self-pay | Admitting: Emergency Medicine

## 2013-09-24 LAB — URINE CULTURE: Colony Count: >=100000

## 2013-09-24 NOTE — Telephone Encounter (Signed)
Post ED Visit - Positive Culture Follow-up  Culture report reviewed by antimicrobial stewardship pharmacist: []  Wes Dulaney, Pharm.D., BCPS [x]  Celedonio MiyamotoJeremy Frens, Pharm.D., BCPS []  Georgina PillionElizabeth Martin, [address]Pharm.D., BCPS []  Candlewick LakeMinh Pham, [place]Pharm.D., BCPS, AAHIVP []  Estella HuskMichelle Turner, Pharm.D., BCPS, AAHIVP []  Harvie JuniorNathan Cope, Pharm.D.  Positive urine culture Treated with Sulfa-Trimeth, organism sensitive to the same and no further patient follow-up is required at this time.  Zeb ComfortKylie Liah Morr 09/24/2013, 7:39 PM

## 2013-09-28 ENCOUNTER — Encounter: Payer: Self-pay | Admitting: Pulmonary Disease

## 2013-09-28 ENCOUNTER — Ambulatory Visit (INDEPENDENT_AMBULATORY_CARE_PROVIDER_SITE_OTHER): Payer: Medicare Other | Admitting: Pulmonary Disease

## 2013-09-28 VITALS — BP 106/68 | HR 78 | Temp 97.9°F | Ht 67.0 in | Wt 201.0 lb

## 2013-09-28 DIAGNOSIS — G4700 Insomnia, unspecified: Secondary | ICD-10-CM

## 2013-09-28 DIAGNOSIS — R2681 Unsteadiness on feet: Secondary | ICD-10-CM

## 2013-09-28 DIAGNOSIS — R269 Unspecified abnormalities of gait and mobility: Secondary | ICD-10-CM

## 2013-09-28 DIAGNOSIS — R0902 Hypoxemia: Secondary | ICD-10-CM

## 2013-09-28 DIAGNOSIS — G4733 Obstructive sleep apnea (adult) (pediatric): Secondary | ICD-10-CM

## 2013-09-28 NOTE — Assessment & Plan Note (Signed)
She reports trouble falling asleep, and uses klonopin for this.  From her symptom description her difficulties with sleep onset might be relate to sleep apnea.  Advised that she can use klonopin on the night of her sleep study if needed.

## 2013-09-28 NOTE — Patient Instructions (Signed)
Will arrange for sleep study Will call to arrange for follow up after sleep study reviewed 

## 2013-09-28 NOTE — Assessment & Plan Note (Signed)
She reports snoring, sleep disruption, apnea, and daytime sleepiness.  She was noted to have oxygen desaturation while asleep during recent hospitalization.  She has hx of depression and anxiety.  I am concerned she could have sleep apnea.  We discussed how sleep apnea can affect various health problems including risks for hypertension, cardiovascular disease, and diabetes.  We also discussed how sleep disruption can increase risks for accident, such as while driving.  Weight loss as a means of improving sleep apnea was also reviewed.  Additional treatment options discussed were CPAP therapy, oral appliance, and surgical intervention.  To further assess will arrange for in lab sleep study.

## 2013-09-28 NOTE — Assessment & Plan Note (Signed)
She thinks this is related to previous mini-stroke.  She is scheduled for evaluation with neurology.  Will defer to neurology about whether she needs to remain on sinemet.

## 2013-09-28 NOTE — Progress Notes (Signed)
   Subjective:    Patient ID: Evelyn Robinson, female    DOB: November 19, 1932, 80 y.o.   MRN: [id number]  HPI    Review of Systems  Constitutional: Negative for fever and unexpected weight change.  HENT: Positive for congestion. Negative for dental problem, ear pain, nosebleeds, postnasal drip, rhinorrhea, sinus pressure, sneezing, sore throat and trouble swallowing.   Eyes: Negative for redness and itching.  Respiratory: Negative for cough, chest tightness, shortness of breath and wheezing.   Cardiovascular: Negative for palpitations and leg swelling.  Gastrointestinal: Negative for nausea and vomiting.  Genitourinary: Negative for dysuria.  Musculoskeletal: Negative for joint swelling.  Skin: Negative for rash.  Neurological: Negative for headaches.  Hematological: Does not bruise/bleed easily.  Psychiatric/Behavioral: Negative for dysphoric mood. The patient is not nervous/anxious.        Objective:   Physical Exam        Assessment & Plan:

## 2013-09-28 NOTE — Progress Notes (Signed)
Chief Complaint  Patient presents with  . Sleep Consult    referred by Dr. Karam for sleep issues    History of Present Illness: Evelyn Robinson is a 80 y.o. female for evaluation of sleep problems.Evelyn Robinson  [place]  She was in hospital recently, and was noted to have low oxygen while asleep.  This raised concern for sleep apnea.  As a result she was referred to pulmonary/sleep medicine.  She has trouble falling asleep, and staying asleep.  She had [place] sleep study in [place] 2 years ago, but was never told what sleep study showed.  She does snore, and will get shallow breathing while asleep.  She has trouble sleeping on her back.  She feels like she has low energy level during the day.  She goes to sleep at 10 pm.  She falls asleep after hours of laying in bed.  She wakes up 2 to 3 times to use the bathroom.  She gets out of bed at 10 am.  She feels tired in the morning.  She denies morning headache.  She does not use anything to help her fall sleep or stay awake.  She denies sleep walking, sleep talking, bruxism, or nightmares.  There is no history of restless legs.  She denies sleep hallucinations, sleep paralysis, or cataplexy.  The Epworth score is 5 out of 24.   Evelyn Robinson  has a past medical history of Anxiety; Depression; GERD (gastroesophageal reflux disease); Vitamin D deficiency disease; Hypothyroid; Urinary retention; Insomnia; Mild cognitive impairment; Gait instability; and Sepsis.  Evelyn Robinson  has past surgical history that includes Abdominal hysterectomy (1980's).  Prior to Admission medications   Medication Sig Start Date End Date Taking? Authorizing Provider  carbidopa-levodopa (SINEMET IR) 25-100 MG per tablet TAKE ONE TABLET BY MOUTH TWICE DAILY 09/13/13   Claudie ReveringJessica M Karam, NP  clonazePAM (KLONOPIN) 1 MG tablet TAKE ONE & ONE-HALF TABLETS BY MOUTH AT BEDTIME 09/13/13   Claudie ReveringJessica M Karam, NP  levothyroxine  (SYNTHROID, LEVOTHROID) 100 MCG tablet TAKE ONE TABLET BY MOUTH ONCE DAILY 09/13/13   Claudie ReveringJessica M Karam, NP  omeprazole (PRILOSEC) 20 MG capsule Take 1 capsule by mouth daily for Acid Reflex 09/13/13   Claudie ReveringJessica M Karam, NP  polyethylene glycol (MIRALAX / GLYCOLAX) packet Take 17 g by mouth 2 (two) times daily as needed. For constipation    Historical Provider, MD  sulfamethoxazole-trimethoprim (SEPTRA DS) 800-160 MG per tablet Take 1 tablet by mouth every 12 (twelve) hours. 09/21/13   Olivia Mackielga M Otter, MD  venlafaxine XR (EFFEXOR-XR) 37.5 MG 24 hr capsule TAKE ONE CAPSULE BY MOUTH ONCE DAILY FOR ANXIETY/ DEPRESSION. 09/13/13   Claudie ReveringJessica M Karam, NP    No Known Allergies  Her family history is not on file.  She  reports that she has never smoked. She does not have any smokeless tobacco history on file. She reports that she drinks alcohol. She reports that she does not use illicit drugs.   Physical Exam:  General - No distress ENT - No sinus tenderness, no oral exudate, no LAN, no thyromegaly, TM clear, pupils equal/reactive, MP 3 Cardiac - s1s2 regular, no murmur, pulses symmetric Chest - No wheeze/rales/dullness, good air entry, normal respiratory excursion Back - No focal tenderness Abd - Soft, non-tender, no organomegaly, + bowel sounds Ext - No edema Neuro - Normal strength, cranial nerves intact Skin - No rashes Psych - Normal mood, and behavior  Assessment/plan:  Evelyn HellingVineet Joy Robinson, M.D. Pager [phone]

## 2013-10-01 ENCOUNTER — Other Ambulatory Visit: Payer: Medicare Other

## 2013-10-01 ENCOUNTER — Other Ambulatory Visit: Payer: Self-pay | Admitting: Nurse Practitioner

## 2013-10-03 ENCOUNTER — Encounter: Payer: Self-pay | Admitting: Nurse Practitioner

## 2013-10-03 ENCOUNTER — Ambulatory Visit (INDEPENDENT_AMBULATORY_CARE_PROVIDER_SITE_OTHER): Payer: Medicare Other | Admitting: Nurse Practitioner

## 2013-10-03 VITALS — BP 138/84 | HR 71 | Temp 98.0°F | Resp 10 | Ht 67.0 in | Wt 203.0 lb

## 2013-10-03 DIAGNOSIS — E782 Mixed hyperlipidemia: Secondary | ICD-10-CM

## 2013-10-03 DIAGNOSIS — E039 Hypothyroidism, unspecified: Secondary | ICD-10-CM

## 2013-10-03 DIAGNOSIS — K219 Gastro-esophageal reflux disease without esophagitis: Secondary | ICD-10-CM

## 2013-10-03 DIAGNOSIS — G912 (Idiopathic) normal pressure hydrocephalus: Secondary | ICD-10-CM

## 2013-10-03 DIAGNOSIS — R2681 Unsteadiness on feet: Secondary | ICD-10-CM

## 2013-10-03 DIAGNOSIS — R0902 Hypoxemia: Secondary | ICD-10-CM

## 2013-10-03 DIAGNOSIS — G3184 Mild cognitive impairment, so stated: Secondary | ICD-10-CM

## 2013-10-03 DIAGNOSIS — Z1231 Encounter for screening mammogram for malignant neoplasm of breast: Secondary | ICD-10-CM

## 2013-10-03 DIAGNOSIS — R269 Unspecified abnormalities of gait and mobility: Secondary | ICD-10-CM

## 2013-10-03 DIAGNOSIS — M25519 Pain in unspecified shoulder: Secondary | ICD-10-CM

## 2013-10-03 NOTE — Patient Instructions (Signed)
Please make sure your insurance will cover pneumonia vaccine   Will get follow up blood work today  Make appt to see Dr Renato Gailseed in 3 months for routine follow up

## 2013-10-03 NOTE — Progress Notes (Signed)
Patient ID: Evelyn MuckleLetty B Robinson, female   DOB: 17-Nov-1932, 80 y.o.   MRN: [id number]    No Known Allergies  Chief Complaint  Patient presents with  . Annual Exam    Yearly exam, no pap,labs completed on Monday, patient states she had an EKG within the last 3 months and it was normal   . Immunizations    Pneumonia vaccine to be given today   . Shoulder Pain    Still with sore shoulder, right side. Taking recommended meds   . FYI    Colonoscopy completed in [place] about 4 years ago, told 1 polyp     HPI: Patient is a 80 y.o. female seen in the office today for [facility]- reports she is not being worked hard enough- does not like therapist, no one has worked with her right shoulder- conts to take aleve and tylenol- shoulder is better  went to ED was found to have UTI- treated with bactrim- tolerated and finished treatment, no on-going symptoms Has seen Dr Craige CottaSood- sleep study scheduled Has not heard about neurology appt but daughter questions why she is on sinemet- could not find name of previous neurologist   Walking with walker and doing some without it, getting stronger Eating what [employer] gives her, eating well balanced diet  Colonoscopy- needs follow up in 2016 due to polyp mammogram - overdue dexa scan- over due- last 3-4 years ago PAP- had hysterectomy in the 80s   Review of Systems:  Review of Systems  Constitutional: Negative for weight loss and malaise/fatigue.  HENT: Negative for congestion, hearing loss and tinnitus.   Eyes:       Wears corrective lens  Respiratory: Negative for cough, sputum production and shortness of breath.   Cardiovascular: Negative for chest pain and leg swelling.  Gastrointestinal: Negative for heartburn, diarrhea and constipation.  Genitourinary: Negative for dysuria, urgency and frequency.       Incontinence of urine  Musculoskeletal: Positive for joint pain (right shoulder) and myalgias (improved).    Neurological: Negative for dizziness, tingling, tremors, focal weakness, weakness and headaches.  Psychiatric/Behavioral: Positive for memory loss (neurologist evaluated). Negative for depression. The patient is not nervous/anxious and does not have insomnia (medication helps).      Past Medical History  Diagnosis Date  . Anxiety   . Depression   . GERD (gastroesophageal reflux disease)   . Vitamin D deficiency disease   . Hypothyroid   . Urinary retention   . Insomnia   . Mild cognitive impairment   . Gait instability   . Sepsis    Past Surgical History  Procedure Laterality Date  . Abdominal hysterectomy  1980's   Social History:   reports that she has never smoked. She does not have any smokeless tobacco history on file. She reports that she drinks alcohol. She reports that she does not use illicit drugs.  History reviewed. No pertinent family history.  Medications: Patient's Medications  New Prescriptions   No medications on file  Previous Medications   CARBIDOPA-LEVODOPA (SINEMET IR) 25-100 MG PER TABLET    TAKE ONE TABLET BY MOUTH TWICE DAILY   CLONAZEPAM (KLONOPIN) 1 MG TABLET    TAKE ONE & ONE-HALF TABLETS BY MOUTH AT BEDTIME   LEVOTHYROXINE (SYNTHROID, LEVOTHROID) 100 MCG TABLET    TAKE ONE TABLET BY MOUTH ONCE DAILY   OMEPRAZOLE (PRILOSEC) 20 MG CAPSULE    Take 1 capsule by mouth daily for Acid Reflex   POLYETHYLENE  GLYCOL (MIRALAX / GLYCOLAX) PACKET    Take 17 g by mouth 2 (two) times daily as needed. For constipation   VENLAFAXINE XR (EFFEXOR-XR) 37.5 MG 24 HR CAPSULE    TAKE ONE CAPSULE BY MOUTH ONCE DAILY FOR ANXIETY/ DEPRESSION.  Modified Medications   No medications on file  Discontinued Medications   No medications on file     Physical Exam:  Filed Vitals:   10/03/13 1122  BP: 138/84  Pulse: 71  Temp: 98 F (36.7 C)  TempSrc: Oral  Resp: 10  Height: 5\' 7"  (1.702 m)  Weight: 203 lb (92.08 kg)  SpO2: 95%    Physical Exam  Constitutional:  She is oriented to person, place, and time and well-developed, well-nourished, and in no distress.  HENT:  Head: Normocephalic and atraumatic.  Right Ear: External ear normal.  Left Ear: External ear normal.  Nose: Nose normal.  Mouth/Throat: Oropharynx is clear and moist. No oropharyngeal exudate.  Eyes: Conjunctivae and EOM are normal. Pupils are equal, round, and reactive to light.  Neck: Normal range of motion. Neck supple.  Cardiovascular: Normal rate, regular rhythm, normal heart sounds and intact distal pulses.   Pulmonary/Chest: Effort normal and breath sounds normal. No respiratory distress.  Abdominal: Soft. Bowel sounds are normal. She exhibits no distension. There is no tenderness.  Musculoskeletal: Normal range of motion. She exhibits no edema and no tenderness.  Neurological: She is alert and oriented to person, place, and time.  Skin: Skin is warm and dry.  Psychiatric: Affect normal.     Labs reviewed: Basic Metabolic Panel:  Recent Labs  16/02/9604/01/15 0019  NA 142  K 3.8  CL 103  CO2 23  GLUCOSE 132*  BUN 24*  CREATININE 0.71  CALCIUM 9.1    Recent Labs  09/21/13 0019  WBC 8.6  HGB 12.8  HCT 38.7  MCV 87.6  PLT 318     Assessment/Plan  1. Elevated triglycerides with high cholesterol -heart healthy diet, to start fish oil daily - Hemoglobin A1c - Lipid panel will repeat per daughters request, she was not sure pt was fasting and would like it re-tested before starting medication   2. Normal pressure hydrocephalus -awaiting neurology referral   3. Hypoxia -conts to follow up with pulmonary  4. Mild cognitive impairment -MMSE 29/30 on todays screening  5. Gait instability -conts with PT, doing well with therapy gaining strength and able to walk better  6. Hypothyroid -conts on synthroid   7. GERD (gastroesophageal reflux disease) -does not wish to reduce medications since it is working -Sales promotion account executiveconts prilosec  8. Pain in joint, shoulder  region -overall is better, taking aleve and tylenol -instructed to stop aleve this was only short term, conts tylenol as needed - Ambulatory referral to Home Health  9. Other screening mammogram - MM DIGITAL SCREENING BILATERAL; Future  To follow up in 3 months

## 2013-10-03 NOTE — Progress Notes (Signed)
Passed clock drawing 

## 2013-10-04 ENCOUNTER — Other Ambulatory Visit: Payer: Self-pay | Admitting: Nurse Practitioner

## 2013-10-04 DIAGNOSIS — E785 Hyperlipidemia, unspecified: Secondary | ICD-10-CM

## 2013-10-04 LAB — HEMOGLOBIN A1C
Est. average glucose Bld gHb Est-mCnc: 134 mg/dL
HEMOGLOBIN A1C: 6.3 % — AB (ref 4.8–5.6)

## 2013-10-04 LAB — LIPID PANEL
Chol/HDL Ratio: 7.6 ratio units — ABNORMAL HIGH (ref 0.0–4.4)
Cholesterol, Total: 275 mg/dL — ABNORMAL HIGH (ref 100–199)
HDL: 36 mg/dL — AB (ref >39)
TRIGLYCERIDES: 447 mg/dL — AB (ref 0–149)

## 2013-10-04 MED ORDER — SIMVASTATIN 20 MG PO TABS: ORAL_TABLET | ORAL | Status: DC

## 2013-10-08 ENCOUNTER — Other Ambulatory Visit: Payer: Self-pay | Admitting: Nurse Practitioner

## 2013-10-22 ENCOUNTER — Encounter: Payer: Self-pay | Admitting: Nurse Practitioner

## 2013-10-24 ENCOUNTER — Other Ambulatory Visit: Payer: Self-pay | Admitting: *Deleted

## 2013-10-24 ENCOUNTER — Telehealth: Payer: Self-pay | Admitting: *Deleted

## 2013-10-24 NOTE — Telephone Encounter (Signed)
Patient daughter Notified and Appointment Scheduled.

## 2013-10-24 NOTE — Telephone Encounter (Signed)
Will not provide refill, needs appt for further discussion

## 2013-10-24 NOTE — Telephone Encounter (Signed)
Patient daughter called and stated that patient has increased her Clonazepam to TWO tablets at bedtime instead of 1 and 1/2. And now has ran out of medication. Daughter wants to know if this can be increased to 2/day and if we can call it in? Stated that this is working good. Please Advise.

## 2013-10-24 NOTE — Telephone Encounter (Signed)
LMOM to return call.

## 2013-10-25 ENCOUNTER — Encounter: Payer: Self-pay | Admitting: Nurse Practitioner

## 2013-10-25 ENCOUNTER — Ambulatory Visit (INDEPENDENT_AMBULATORY_CARE_PROVIDER_SITE_OTHER): Payer: Medicare Other | Admitting: Nurse Practitioner

## 2013-10-25 VITALS — BP 112/68 | HR 76 | Temp 98.1°F | Wt 204.0 lb

## 2013-10-25 DIAGNOSIS — G4700 Insomnia, unspecified: Secondary | ICD-10-CM

## 2013-10-25 MED ORDER — CLONAZEPAM 1 MG PO TABS: 1.0000 mg | ORAL_TABLET | Freq: Every day | ORAL | Status: DC

## 2013-10-25 NOTE — Patient Instructions (Signed)
Take 1 mg of clonazepam at night for 1 week then decrease to 1/2 tablet at night for 6 days then stop  May take melatonin 6 mg at night to help with sleep  Keep follow up with [facility]   Insomnia Insomnia is frequent trouble falling and/or staying asleep. Insomnia can be a long term problem or a short term problem. Both are common. Insomnia can be a short term problem when the wakefulness is related to a certain stress or worry. Long term insomnia is often related to ongoing stress during waking hours and/or poor sleeping habits. Overtime, sleep deprivation itself can make the problem worse. Every little thing feels more severe because you are overtired and your ability to cope is decreased. CAUSES   Stress, anxiety, and depression.  Poor sleeping habits.  Distractions such as TV in the bedroom.  Naps close to bedtime.  Engaging in emotionally charged conversations before bed.  Technical reading before sleep.  Alcohol and other sedatives. They may make the problem worse. They can hurt normal sleep patterns and normal dream activity.  Stimulants such as caffeine for several hours prior to bedtime.  Pain syndromes and shortness of breath can cause insomnia.  Exercise late at night.  Changing time zones may cause sleeping problems (jet lag). It is sometimes helpful to have someone observe your sleeping patterns. They should look for periods of not breathing during the night (sleep apnea). They should also look to see how long those periods last. If you live alone or observers are uncertain, you can also be observed at a sleep clinic where your sleep patterns will be professionally monitored. Sleep apnea requires a checkup and treatment. Give your caregivers your medical history. Give your caregivers observations your family has made about your sleep.  SYMPTOMS   Not feeling rested in the morning.  Anxiety and restlessness at bedtime.  Difficulty falling and staying asleep. TREATMENT    Your caregiver may prescribe treatment for an underlying medical disorders. Your caregiver can give advice or help if you are using alcohol or other drugs for self-medication. Treatment of underlying problems will usually eliminate insomnia problems.  Medications can be prescribed for short time use. They are generally not recommended for lengthy use.  Over-the-counter sleep medicines are not recommended for lengthy use. They can be habit forming.  You can promote easier sleeping by making lifestyle changes such as:  Using relaxation techniques that help with breathing and reduce muscle tension.  Exercising earlier in the day.  Changing your diet and the time of your last meal. No night time snacks.  Establish a regular time to go to bed.  Counseling can help with stressful problems and worry.  Soothing music and white noise may be helpful if there are background noises you cannot remove.  Stop tedious detailed work at least one hour before bedtime. HOME CARE INSTRUCTIONS   Keep a diary. Inform your caregiver about your progress. This includes any medication side effects. See your caregiver regularly. Take note of:  Times when you are asleep.  Times when you are awake during the night.  The quality of your sleep.  How you feel the next day. This information will help your caregiver care for you.  Get out of bed if you are still awake after 15 minutes. Read or do some quiet activity. Keep the lights down. Wait until you feel sleepy and go back to bed.  Keep regular sleeping and waking hours. Avoid naps.  Exercise regularly.  Avoid  distractions at bedtime. Distractions include watching television or engaging in any intense or detailed activity like attempting to balance the household checkbook.  Develop a bedtime ritual. Keep a familiar routine of bathing, brushing your teeth, climbing into bed at the same time each night, listening to soothing music. Routines increase the  success of falling to sleep faster.  Use relaxation techniques. This can be using breathing and muscle tension release routines. It can also include visualizing peaceful scenes. You can also help control troubling or intruding thoughts by keeping your mind occupied with boring or repetitive thoughts like the old concept of counting sheep. You can make it more creative like imagining planting one beautiful flower after another in your backyard garden.  During your day, work to eliminate stress. When this is not possible use some of the previous suggestions to help reduce the anxiety that accompanies stressful situations. MAKE SURE YOU:   Understand these instructions.  Will watch your condition.  Will get help right away if you are not doing well or get worse. Document Released: 05/07/2000 Document Revised: 08/02/2011 Document Reviewed: 06/07/2007 [facility] Patient Information 2014 [place].

## 2013-10-25 NOTE — Progress Notes (Signed)
Patient ID: Evelyn Robinson, female   DOB: 08/27/1932, 80 y.o.   MRN: [id number]    No Known Allergies  Chief Complaint  Patient presents with  . Medication Management    Renew medications (clonazepam)     HPI: Patient is a 80 y.o. female seen in the office today for medication refill because she has taken her clonazepam at a higher dose than prescribed. Has been more more groggy in the morning and has had more frequent falls and sleeping late.  Increase stress therefore she is not able to sleep at night so she decided to increase the dose to 2 tablets of klonopin at night.  Sleeping 7 hours a night, she gets up at 8-8:30 but does not get out of the bed until she has to be at lunch at 12:30. Then naps after lunch.   Review of Systems:  Review of Systems  Constitutional: Negative for weight loss and malaise/fatigue.  HENT: Negative for congestion, hearing loss and tinnitus.   Eyes:       Wears corrective lens  Respiratory: Negative for cough, sputum production and shortness of breath.   Cardiovascular: Negative for chest pain and leg swelling.  Gastrointestinal: Negative for heartburn, diarrhea and constipation.  Genitourinary: Negative for dysuria, urgency and frequency.  Neurological: Negative for dizziness, tingling, tremors, focal weakness, weakness and headaches.  Psychiatric/Behavioral: Positive for memory loss (neurologist evaluated). Negative for depression. The patient has insomnia. The patient is not nervous/anxious.      Past Medical History  Diagnosis Date  . Anxiety   . Depression   . GERD (gastroesophageal reflux disease)   . Vitamin D deficiency disease   . Hypothyroid   . Urinary retention   . Insomnia   . Mild cognitive impairment   . Gait instability   . Sepsis    Past Surgical History  Procedure Laterality Date  . Abdominal hysterectomy  1980's   Social History:   reports that she has never smoked. She does not have any smokeless tobacco history on  file. She reports that she drinks alcohol. She reports that she does not use illicit drugs.  History reviewed. No pertinent family history.  Medications: Patient's Medications  New Prescriptions   No medications on file  Previous Medications   CARBIDOPA-LEVODOPA (SINEMET IR) 25-100 MG PER TABLET    TAKE ONE TABLET BY MOUTH TWICE DAILY   CLONAZEPAM (KLONOPIN) 1 MG TABLET    TAKE ONE & ONE-HALF TABLETS BY MOUTH AT BEDTIME   LEVOTHYROXINE (SYNTHROID, LEVOTHROID) 100 MCG TABLET    TAKE ONE TABLET BY MOUTH ONCE DAILY   OMEPRAZOLE (PRILOSEC) 20 MG CAPSULE    TAKE ONE CAPSULE BY MOUTH TWICE DAILY FOR  ACID  REFLUX   POLYETHYLENE GLYCOL (MIRALAX / GLYCOLAX) PACKET    Take 17 g by mouth 2 (two) times daily as needed. For constipation   SIMVASTATIN (ZOCOR) 20 MG TABLET    Take 1/2 tablet daily at bedtime for 4 weeks then increase to whole tablet daily at bedtime   VENLAFAXINE XR (EFFEXOR-XR) 37.5 MG 24 HR CAPSULE    TAKE ONE CAPSULE BY MOUTH ONCE DAILY FOR ANXIETY/ DEPRESSION.  Modified Medications   No medications on file  Discontinued Medications   No medications on file     Physical Exam:  Filed Vitals:   10/25/13 1305  BP: 112/68  Pulse: 76  Temp: 98.1 F (36.7 C)  TempSrc: Oral  Weight: 204 lb (92.534 kg)  SpO2: 94%   Physical Exam  Constitutional: She appears well-developed and well-nourished. No distress.  HENT:  Mouth/Throat: Oropharynx is clear and moist. No oropharyngeal exudate.  Small scab under chin from fall  Eyes: Conjunctivae and EOM are normal. Pupils are equal, round, and reactive to light.  Neck: Normal range of motion. Neck supple.  Cardiovascular: Normal rate, regular rhythm and normal heart sounds.   Pulmonary/Chest: Effort normal and breath sounds normal.  Abdominal: Soft. Bowel sounds are normal.  Musculoskeletal: She exhibits no edema.  Neurological: She is alert.  Skin: Skin is warm and dry.  Psychiatric: She has a normal mood and affect.      Labs  reviewed: Basic Metabolic Panel:  Recent Labs  62/13/803/06/08 0019  NA 142  K 3.8  CL 103  CO2 23  GLUCOSE 132*  BUN 24*  CREATININE 0.71  CALCIUM 9.1   Liver Function Tests: No results found for this basename: AST, ALT, ALKPHOS, BILITOT, PROT, ALBUMIN,  in the last 8760 hours No results found for this basename: LIPASE, AMYLASE,  in the last 8760 hours No results found for this basename: AMMONIA,  in the last 8760 hours CBC:  Recent Labs  09/21/13 0019  WBC 8.6  HGB 12.8  HCT 38.7  MCV 87.6  PLT 318   Lipid Panel:  Recent Labs  10/03/13 1226  HDL 36*  LDLCALC Comment  TRIG 447*  CHOLHDL 7.6*   TSH: No results found for this basename: TSH,  in the last 8760 hours A1C: Lab Results  Component Value Date   HGBA1C 6.3* 10/03/2013     Assessment/Plan  1. Insomnia -discussed proper sleep regime  -discussed NOT changing prescribed medications -will titrate stop clonopin due to making her more groggy during the day and due to age - clonazePAM (KLONOPIN) 1 MG tablet; Take 1 tablet (1 mg total) by mouth at bedtime. For next 7 days then take 1/2 tablet for 6 days then STOP  Dispense: 10 tablet; Refill: 0 -discussed being more active during the day, getting oob in the morning and not staying in bed until noon, exercise -pt also not taking Effexor, to start taking this regularly to help with anxiety  -may take melatonin 6 mg q hs prn  Keep follow up   Over 30 mins Time TOTAL:  time greater than 50% of total time spent doing pt counseled and coordination of care regarding insomnia

## 2013-11-08 ENCOUNTER — Ambulatory Visit: Payer: Self-pay | Admitting: Nurse Practitioner

## 2013-11-10 ENCOUNTER — Other Ambulatory Visit: Payer: Self-pay | Admitting: Nurse Practitioner

## 2013-11-13 ENCOUNTER — Ambulatory Visit (HOSPITAL_BASED_OUTPATIENT_CLINIC_OR_DEPARTMENT_OTHER): Payer: Medicare Other | Attending: Pulmonary Disease

## 2013-11-13 VITALS — Ht 67.0 in | Wt 203.0 lb

## 2013-11-13 DIAGNOSIS — R0989 Other specified symptoms and signs involving the circulatory and respiratory systems: Secondary | ICD-10-CM | POA: Insufficient documentation

## 2013-11-13 DIAGNOSIS — Z79899 Other long term (current) drug therapy: Secondary | ICD-10-CM | POA: Insufficient documentation

## 2013-11-13 DIAGNOSIS — G4710 Hypersomnia, unspecified: Secondary | ICD-10-CM | POA: Insufficient documentation

## 2013-11-13 DIAGNOSIS — R0609 Other forms of dyspnea: Secondary | ICD-10-CM | POA: Insufficient documentation

## 2013-11-13 DIAGNOSIS — R0902 Hypoxemia: Secondary | ICD-10-CM

## 2013-11-13 DIAGNOSIS — G4700 Insomnia, unspecified: Secondary | ICD-10-CM | POA: Insufficient documentation

## 2013-11-15 ENCOUNTER — Telehealth: Payer: Self-pay | Admitting: *Deleted

## 2013-11-15 NOTE — Telephone Encounter (Signed)
Patient states that she does have a appointment with a Neurologist but stated that she could not find her card to let me know when the appointment is. Patient stated that she does not think it is coming from the hydrocephalous, that its coming from the medication. Will call back tomorrow and schedule an appointment with Dr. Glade LloydPandey.

## 2013-11-15 NOTE — Telephone Encounter (Signed)
The dizziness could be coming from her normal pressure hydrocephalous needs to get in with neurology  I will follow up on sleep study To make appt with Glade LloydPandey next week if needed

## 2013-11-15 NOTE — Telephone Encounter (Signed)
Patient stated that patient had her sleep study on Tuesday. Her problem is she can't even think and fuzzy headed. She states the drugs are causing this. Patient states she is off of the Klonopin now. Also not taking the Melantonin. Patient thinks she is having withdrawals from the Klonopin. [facility] Patient stands and she gets dizzy and stumbles. Please Advise.

## 2013-11-19 NOTE — Telephone Encounter (Signed)
Evelyn Robinson came into the office today.  Having problems with dizzness, numbness in her fingers and toes.  Evelyn. [name] spoke with the provider on call over the weekend- says she was told to bring all meds to the office. Instead Evelyn. [name] brought the meds that were in her pill container.  Evelyn Robinson says she has been experiening the dizzness since she started the a new medication.   Asked patient for Pharmacy name,she did not know.... Called patients daughter and son in law. Left message 11-19-2013 12pm and 2:03pm Spoke with Dr. Renato Gailseed, says to scheduled appointment with the Neurologist- Appointment has been scheduled with the Neurologist for November 27, 2013 at 9:00am...Marland Kitchen

## 2013-11-21 ENCOUNTER — Telehealth: Payer: Self-pay | Admitting: Pulmonary Disease

## 2013-11-21 DIAGNOSIS — G4730 Sleep apnea, unspecified: Secondary | ICD-10-CM

## 2013-11-21 DIAGNOSIS — G4710 Hypersomnia, unspecified: Secondary | ICD-10-CM

## 2013-11-21 NOTE — Telephone Encounter (Signed)
PSG 11/13/13 >> AHI 0, SaO2 low 88%, PLMI 0.  Sleep onset 227.5 minutes.  Will have my nurse schedule ROV to review test results.

## 2013-11-21 NOTE — Sleep Study (Signed)
[facility]  NAME: Evelyn ButtnerLetty B Robinson DATE OF BIRTH:  1933-02-21 MEDICAL RECORD NUMBER [id number]  LOCATION: [facility]  PHYSICIAN: Evelyn HellingVineet Amand Robinson, M.D. DATE OF STUDY: 11/13/2013  SLEEP STUDY TYPE: Polysomnogram               REFERRING PHYSICIAN: Coralyn HellingSood, Larina Lieurance, MD  INDICATION FOR STUDY:  Evelyn MuckleLetty B Robinson is a 80 y.o. female who presents to the sleep lab for evaluation of hypersomnia with obstructive sleep apnea.  She reports snoring, sleep disruption, apnea, and daytime sleepiness.  EPWORTH SLEEPINESS SCORE: 0. HEIGHT: 5\' 7"  (170.2 cm)  WEIGHT: 203 lb (92.08 kg)    Body mass index is 31.79 kg/(m^2).  NECK SIZE: 16 in.  MEDICATIONS:  Current Outpatient Prescriptions on File Prior to Visit  Medication Sig Dispense Refill  . carbidopa-levodopa (SINEMET IR) 25-100 MG per tablet TAKE ONE TABLET BY MOUTH TWICE DAILY  60 tablet  2  . clonazePAM (KLONOPIN) 1 MG tablet Take 1 tablet (1 mg total) by mouth at bedtime. For next 7 days then take 1/2 tablet for 6 days then STOP  10 tablet  0  . levothyroxine (SYNTHROID, LEVOTHROID) 100 MCG tablet TAKE ONE TABLET BY MOUTH ONCE DAILY  30 tablet  0  . omeprazole (PRILOSEC) 20 MG capsule TAKE ONE CAPSULE BY MOUTH TWICE DAILY FOR  ACID  REFLUX  60 capsule  2  . polyethylene glycol (MIRALAX / GLYCOLAX) packet Take 17 g by mouth 2 (two) times daily as needed. For constipation      . simvastatin (ZOCOR) 20 MG tablet Take 1/2 tablet daily at bedtime for 4 weeks then increase to whole tablet daily at bedtime  90 tablet  0  . venlafaxine XR (EFFEXOR-XR) 37.5 MG 24 hr capsule TAKE ONE CAPSULE BY MOUTH ONCE DAILY FOR ANXIETY/ DEPRESSION.  30 capsule  2   No current facility-administered medications on file prior to visit.    SLEEP ARCHITECTURE:  Total recording time: 369.5 minutes.  Total sleep time was: 129 minutes.  Sleep efficiency: 34.9%.  Sleep latency: 227.5 minutes.  REM latency: 110 minutes.  Stage N1: 14.7%.   Stage N2: 77.9%.  Stage N3: 0%.  Stage R:  7.4%.  Supine sleep: 88.5 minutes.  Non-supine sleep: 40.5 minutes.  CARDIAC DATA:  Average heart rate: 67 beats per minute. Rhythm strip: sinus rhythm.  RESPIRATORY DATA: Average respiratory rate: 19. Snoring: mild. Average AHI: 0.   Apnea index: 0.  Hypopnea index: 0. Obstructive apnea index: 0.  Central apnea index: 0.  Mixed apnea index: 0. REM AHI: 0.  NREM AHI: 0. Supine AHI: 0. Non-supine AHI: 0.  MOVEMENT/PARASOMNIA:  Periodic limb movement: 0.  Period limb movements with arousals: 0. Restroom trips: none.  OXYGEN DATA:  Baseline oxygenation: 95%. Lowest SaO2: 88%. Time spent below SaO2 90%: 3 minutes. Supplemental oxygen used: none.  IMPRESSION/ RECOMMENDATION:   This study did not show evidence for sleep disordered breathing or periodic limb movements.  She had significant delay in sleep onset and had reduction in the percentage of N3 and REM sleep.   Clinical correlation would be need to further assess issues with insomnia.  Proper sleep hygiene should be emphasized.  She may also benefit from cognitive behavioral therapy.  Evelyn HellingVineet Hendrik Donath, M.D. Diplomate, Biomedical engineerAmerican Board of Sleep Medicine  ELECTRONICALLY SIGNED ON:  11/21/2013, 9:23 AM [facility] PH: [phone]   FX: [phone] ACCREDITED BY THE AMERICAN ACADEMY OF SLEEP MEDICINE

## 2013-11-22 ENCOUNTER — Ambulatory Visit: Payer: Medicare Other | Admitting: Internal Medicine

## 2013-11-22 DIAGNOSIS — Z0289 Encounter for other administrative examinations: Secondary | ICD-10-CM

## 2013-11-22 LAB — CBC WITH DIFFERENTIAL
Basophils Absolute: 0 10*3/uL (ref 0.0–0.2)
Basos: 0 %
EOS: 4 %
Eosinophils Absolute: 0.3 10*3/uL (ref 0.0–0.4)
HCT: 37.9 % (ref 34.0–46.6)
Hemoglobin: 12.4 g/dL (ref 11.1–15.9)
IMMATURE GRANULOCYTES: 0 %
Immature Grans (Abs): 0 10*3/uL (ref 0.0–0.1)
Lymphocytes Absolute: 2.8 10*3/uL (ref 0.7–3.1)
Lymphs: 35 %
MCH: 28.1 pg (ref 26.6–33.0)
MCHC: 32.7 g/dL (ref 31.5–35.7)
MCV: 86 fL (ref 79–97)
MONOS ABS: 0.6 10*3/uL (ref 0.1–0.9)
Monocytes: 7 %
Neutrophils Absolute: 4.4 10*3/uL (ref 1.4–7.0)
Neutrophils Relative %: 54 %
PLATELETS: 307 10*3/uL (ref 150–379)
RBC: 4.41 x10E6/uL (ref 3.77–5.28)
RDW: 15.5 % — AB (ref 12.3–15.4)
WBC: 8.1 10*3/uL (ref 3.4–10.8)

## 2013-11-22 LAB — LIPID PANEL W/O CHOL/HDL RATIO
Cholesterol, Total: 241 mg/dL — ABNORMAL HIGH (ref 100–199)
HDL: 35 mg/dL — ABNORMAL LOW (ref >39)
TRIGLYCERIDES: 487 mg/dL — AB (ref 0–149)

## 2013-11-22 LAB — COMPREHENSIVE METABOLIC PANEL
A/G RATIO: 2.1 (ref 1.1–2.5)
ALT: 15 IU/L (ref 0–32)
AST: 21 IU/L (ref 0–40)
Albumin: 4.2 g/dL (ref 3.5–4.8)
Alkaline Phosphatase: 115 IU/L (ref 39–117)
BUN / CREAT RATIO: 28 — AB (ref 11–26)
BUN: 22 mg/dL (ref 8–27)
CO2: 24 mmol/L (ref 18–29)
Calcium: 9.2 mg/dL (ref 8.7–10.3)
Chloride: 103 mmol/L (ref 97–108)
Creatinine, Ser: 0.78 mg/dL (ref 0.57–1.00)
GFR calc Af Amer: 84 mL/min/{1.73_m2} (ref >59)
GFR calc non Af Amer: 73 mL/min/{1.73_m2} (ref >59)
Globulin, Total: 2 g/dL (ref 1.5–4.5)
Glucose: 110 mg/dL — ABNORMAL HIGH (ref 65–99)
Potassium: 4.4 mmol/L (ref 3.5–5.2)
Sodium: 142 mmol/L (ref 134–144)
Total Bilirubin: 0.2 mg/dL (ref 0.0–1.2)
Total Protein: 6.2 g/dL (ref 6.0–8.5)

## 2013-11-22 LAB — TSH: TSH: 1.66 u[IU]/mL (ref 0.450–4.500)

## 2013-11-27 ENCOUNTER — Encounter: Payer: Self-pay | Admitting: Diagnostic Neuroimaging

## 2013-11-27 ENCOUNTER — Telehealth: Payer: Self-pay

## 2013-11-27 ENCOUNTER — Ambulatory Visit (INDEPENDENT_AMBULATORY_CARE_PROVIDER_SITE_OTHER): Payer: Medicare Other | Admitting: Diagnostic Neuroimaging

## 2013-11-27 VITALS — BP 140/79 | HR 64 | Temp 98.1°F | Ht 67.0 in | Wt 199.6 lb

## 2013-11-27 DIAGNOSIS — R269 Unspecified abnormalities of gait and mobility: Secondary | ICD-10-CM

## 2013-11-27 DIAGNOSIS — R413 Other amnesia: Secondary | ICD-10-CM

## 2013-11-27 DIAGNOSIS — G4700 Insomnia, unspecified: Secondary | ICD-10-CM

## 2013-11-27 DIAGNOSIS — F411 Generalized anxiety disorder: Secondary | ICD-10-CM

## 2013-11-27 NOTE — Telephone Encounter (Signed)
Called daughter April to have her schedule and come in with patient for a 6 week follow up appt. No answer. Left message. Will try later.

## 2013-11-27 NOTE — Progress Notes (Signed)
[facility]  PATIENT: Evelyn ButtnerLetty B Robinson DOB: January 06, 1933  REFERRING CLINICIAN: Karam HISTORY FROM: patient  REASON FOR VISIT: new consult   HISTORICAL  CHIEF COMPLAINT:  Chief Complaint  Patient presents with  . normal pressure hydrocephalus  . Insomnia  . Pain    shoulder    HISTORY OF PRESENT ILLNESS:   80 year old right-handed female here for NPH evaluation. Patient is alone for this visit. She has limited information about her medical history and her medications.  In December 2014 patient had confusion, malaise, weakness, was diagnosed with sepsis. She's been in the hospital for one week. During hospital evaluation it was noted that she had some tremor and had been on carbidopa/levodopa. Apparently before going to the hospital she was evaluated for tremor and diagnosed with possible Parkinson's disease versus NPH. We have no information about this workup or where was done. Patient does not recall seeing a neurologist. Patient then transition to rehabilitation and then skilled nursing facility. [facility] [place]. Now she lives in independent living facility.  Patient does not know what medications she takes. Apparently they're set up by her daughter in a pill box. She denies tremor. She has had some walking and balance difficulties over past 3-4 months. She uses a walker at times.  REVIEW OF SYSTEMS: Full 14 system review of systems performed and notable only for insomnia snoring blurred vision incontinence.  ALLERGIES: Allergies  Allergen Reactions  . Atorvastatin Other (See Comments)    Other Reaction: Other reaction    HOME MEDICATIONS: Outpatient Prescriptions Prior to Visit  Medication Sig Dispense Refill  . carbidopa-levodopa (SINEMET IR) 25-100 MG per tablet TAKE ONE TABLET BY MOUTH TWICE DAILY  60 tablet  2  . levothyroxine (SYNTHROID, LEVOTHROID) 100 MCG tablet TAKE ONE TABLET BY MOUTH ONCE  DAILY  30 tablet  0  . venlafaxine XR (EFFEXOR-XR) 37.5 MG 24 hr capsule TAKE ONE CAPSULE BY MOUTH ONCE DAILY FOR ANXIETY/ DEPRESSION.  30 capsule  2  . simvastatin (ZOCOR) 20 MG tablet Take 1/2 tablet daily at bedtime for 4 weeks then increase to whole tablet daily at bedtime  90 tablet  0  . polyethylene glycol (MIRALAX / GLYCOLAX) packet Take 17 g by mouth 2 (two) times daily as needed. For constipation      . clonazePAM (KLONOPIN) 1 MG tablet Take 1 tablet (1 mg total) by mouth at bedtime. For next 7 days then take 1/2 tablet for 6 days then STOP  10 tablet  0  . omeprazole (PRILOSEC) 20 MG capsule TAKE ONE CAPSULE BY MOUTH TWICE DAILY FOR  ACID  REFLUX  60 capsule  2   No facility-administered medications prior to visit.    PAST MEDICAL HISTORY: Past Medical History  Diagnosis Date  . Anxiety   . Depression   . GERD (gastroesophageal reflux disease)   . Vitamin D deficiency disease   . Hypothyroid   . Urinary retention   . Insomnia   . Mild cognitive impairment   . Gait instability   . Sepsis     PAST SURGICAL HISTORY: Past Surgical History  Procedure Laterality Date  . Abdominal hysterectomy  1980's    FAMILY HISTORY: History reviewed. No pertinent family history.  SOCIAL HISTORY:  History   Social History  . Marital Status: Divorced    Spouse Name: N/A    Number of Children: 3  . Years of Education: Grad sch   Occupational History  .  Retired Other   Social History Main Topics  . Smoking status: Never Smoker   . Smokeless tobacco: Never Used  . Alcohol Use: Yes     Comment: maybe wine at christmas   . Drug Use: No  . Sexual Activity: No   Other Topics Concern  . Not on file   Social History Narrative   Patient lives in [place] home alone.    Caffeine Use: quit more than 40yrs ago     PHYSICAL EXAM  Filed Vitals:   11/27/13 0917  BP: 140/79  Pulse: 64  Temp: 98.1 F (36.7 C)  TempSrc: Oral  Height: 5\' 7"  (1.702 m)    Weight: 199 lb 9.6 oz (90.538 kg)    Not recorded    Body mass index is 31.25 kg/(m^2).  GENERAL EXAM: Patient is in no distress; well developed, nourished and groomed; neck is supple  CARDIOVASCULAR: Regular rate and rhythm, no murmurs, no carotid bruits  NEUROLOGIC: MENTAL STATUS: awake, alert, oriented to person, place and time, recent and remote memory intact, normal attention and concentration, language fluent, comprehension intact, naming intact, fund of knowledge appropriate; EXCEPT MMSE 28/30  (MISSES 1 FOR DATE (16TH)). AFT 8. POSITIVE SNOUT AND MYERSONS.  CRANIAL NERVE: no papilledema on fundoscopic exam, pupils equal and reactive to light, visual fields full to confrontation, extraocular muscles intact, no nystagmus, facial sensation and strength symmetric, hearing intact, palate elevates symmetrically, uvula midline, shoulder shrug symmetric, tongue midline. MOTOR: normal bulk, full strength in the BUE, BLE; NO TREMOR. MILD COGWHEELING AND BRADYKINESIA IN LUE SENSORY: normal and symmetric to light touch; DECR VIB AT TOES (L WORSE THAN R) COORDINATION: finger-nose-finger, fine finger movements SLOW ON LEFT REFLEXES: BUE TRACE, KNEES TRACE, ANKLES 0 GAIT/STATION: narrow based gait; SLOW AND CAUTIOUS. DECR ARM SWING. SMOOTH TURNS. Able to walk on toes, heels; DIFF WITH TANDEM. Romberg is negative    DIAGNOSTIC DATA (LABS, IMAGING, TESTING) - I reviewed patient records, labs, notes, testing and imaging myself where available.  Lab Results  Component Value Date   WBC 8.1 10/01/2013   HGB 12.4 10/01/2013   HCT 37.9 10/01/2013   MCV 86 10/01/2013   PLT 307 10/01/2013      Component Value Date/Time   NA 142 10/01/2013 0936   NA 142 09/21/2013 0019   K 4.4 10/01/2013 0936   CL 103 10/01/2013 0936   CO2 24 10/01/2013 0936   GLUCOSE 110* 10/01/2013 0936   GLUCOSE 132* 09/21/2013 0019   BUN 22 10/01/2013 0936   BUN 24* 09/21/2013 0019   CREATININE 0.78 10/01/2013 0936   CALCIUM 9.2  10/01/2013 0936   PROT 6.2 10/01/2013 0936   AST 21 10/01/2013 0936   ALT 15 10/01/2013 0936   ALKPHOS 115 10/01/2013 0936   BILITOT 0.2 10/01/2013 0936   GFRNONAA 73 10/01/2013 0936   GFRAA 84 10/01/2013 0936   Lab Results  Component Value Date   HDL 36* 10/03/2013   LDLCALC Comment 10/03/2013   TRIG 447* 10/03/2013   CHOLHDL 7.6* 10/03/2013   Lab Results  Component Value Date   HGBA1C 6.3* 10/03/2013   No results found for this basename: VITAMINB12   Lab Results  Component Value Date   TSH 1.660 10/01/2013    I reviewed images myself and agree with interpretation. -VRP  CT HEAD 1. No acute intracranial abnormality.  2. Mild generalized cerebral atrophy with moderate chronic microvascular ischemic disease.   ASSESSMENT AND PLAN  80 y.o. year old female  here with memory loss, gait decline, incontinence, and transition to assisted living in the past 1 year. Not much collateral information at this visit (daughter not here; prior hospital records not available). There may be an underlying neurodegenerative process (dementia with lewy bodies, alzheimer's, vascular dementia). Some parkinsonian features. Some frontal release signs. Some neuropathy signs. CT head shows atrophy and chronic small vessel ischemic disease. Overall low suspicion for normal pressure hydrocephalus.  PLAN: Orders Placed This Encounter  Procedures  . MR Brain Wo Contrast  . TSH  . Vitamin B12  . Hemoglobin A1c   Return in about 6 weeks (around 01/08/2014). with daughter. Will request hospital records.    Suanne MarkerVIKRAM R. PENUMALLI, MD 11/27/2013, 10:19 AM Certified in Neurology, Neurophysiology and Neuroimaging  [facility] [address], [address] [place], [place] [address] [phone]

## 2013-11-27 NOTE — Telephone Encounter (Signed)
Pt schedule 7/24 at 2pm to review sleep study. Nothing further needed.

## 2013-11-27 NOTE — Patient Instructions (Signed)
I will check MRI and labs. 

## 2013-11-28 LAB — TSH: TSH: 1.85 u[IU]/mL (ref 0.450–4.500)

## 2013-11-28 LAB — HEMOGLOBIN A1C
ESTIMATED AVERAGE GLUCOSE: 128 mg/dL
Hgb A1c MFr Bld: 6.1 % — ABNORMAL HIGH (ref 4.8–5.6)

## 2013-11-28 LAB — VITAMIN B12: VITAMIN B 12: 332 pg/mL (ref 211–946)

## 2013-11-28 NOTE — Telephone Encounter (Signed)
Spoke to CSX CorporationJay Harris. He said April will be home from [place] on Friday (11/30/13) evening. He will have her to call our office on Monday (12/03/13) to coordinate pt's 6 week f/u appt.

## 2013-12-04 ENCOUNTER — Ambulatory Visit
Admission: RE | Admit: 2013-12-04 | Discharge: 2013-12-04 | Disposition: A | Discharge status: Home / Self Care | Payer: Medicare Other | Source: Ambulatory Visit | Attending: Diagnostic Neuroimaging | Admitting: Diagnostic Neuroimaging

## 2013-12-04 DIAGNOSIS — R413 Other amnesia: Secondary | ICD-10-CM

## 2013-12-04 DIAGNOSIS — F411 Generalized anxiety disorder: Secondary | ICD-10-CM

## 2013-12-04 DIAGNOSIS — R269 Unspecified abnormalities of gait and mobility: Secondary | ICD-10-CM

## 2013-12-04 DIAGNOSIS — G4700 Insomnia, unspecified: Secondary | ICD-10-CM

## 2013-12-06 ENCOUNTER — Telehealth: Payer: Self-pay | Admitting: *Deleted

## 2013-12-06 NOTE — Telephone Encounter (Signed)
I called pt and relayed that f/a with Dr. Marjory LiesPenumalli is the 12-18-13 at 0900 and she stated cannot come so early takes sleeping pill and she would be very sleepy.  I offered 12-19-13 at 1030 and made this appt. For her.  Will call April, daughter and let her know about appt.

## 2013-12-06 NOTE — Telephone Encounter (Signed)
Spoke to pt who is very anxious re: what is going on with her. She states her sx of buzzing in head, (sound and feeling like drunk) and the pins and needles in fingers and feet (bilaterally) is getting worse.  She had her MRI and labs done (results given).  Would like appt. (did not want to go back to [place] who referred her).  [phone].

## 2013-12-06 NOTE — Telephone Encounter (Signed)
Setup follow up appt with patient and daughter in next 1-2 weeks. -VRP

## 2013-12-06 NOTE — Telephone Encounter (Signed)
Calling again.  Needs to know what to do about her situation.  Please call. [phone]

## 2013-12-10 ENCOUNTER — Other Ambulatory Visit: Payer: Self-pay | Admitting: Nurse Practitioner

## 2013-12-10 NOTE — Telephone Encounter (Signed)
I called and LMVM for daughter to confirm her mothers appt, as well as to confirm she would be there as well.

## 2013-12-14 ENCOUNTER — Ambulatory Visit: Payer: Medicare Other | Admitting: Pulmonary Disease

## 2013-12-19 ENCOUNTER — Encounter: Payer: Self-pay | Admitting: Diagnostic Neuroimaging

## 2013-12-19 ENCOUNTER — Ambulatory Visit (INDEPENDENT_AMBULATORY_CARE_PROVIDER_SITE_OTHER): Payer: Medicare Other | Admitting: Diagnostic Neuroimaging

## 2013-12-19 VITALS — BP 126/78 | HR 66 | Temp 97.6°F | Ht 67.0 in | Wt 202.0 lb

## 2013-12-19 DIAGNOSIS — R269 Unspecified abnormalities of gait and mobility: Secondary | ICD-10-CM

## 2013-12-19 DIAGNOSIS — R413 Other amnesia: Secondary | ICD-10-CM

## 2013-12-19 NOTE — Patient Instructions (Signed)
Reduce carbidopa/levodopa to half tab twice a day for 1 week, then reduce to half tab once a day for a week, then stop.

## 2013-12-19 NOTE — Progress Notes (Signed)
[facility]  PATIENT: Evelyn Robinson DOB: Dec 30, 1932  REFERRING CLINICIAN: Karam HISTORY FROM: patient  REASON FOR VISIT: new consult   HISTORICAL  CHIEF COMPLAINT:  Chief Complaint  Patient presents with  . Follow-up    memory loss    HISTORY OF PRESENT ILLNESS:   UPDATE 12/19/13: Patient returns with daughter. Also, prior records from [facility] reviewed. Based upon and further information, patient had a gradual progressive decline in memory and cognitive ability over the past 2 years. Patient was also on Klonopin and apparently getting increased sedation. She was not taking care of her self, gladly take the medications, and he transition away from driving. Patient also was not eating or drinking consistently. Ultimately patient was admitted to the hospital in February 2015, diagnosed with UTI, sepsis, cognitive impairment, gait difficulty. Patient was then transition to independent living facility in [place]. Since that time she has made a significant improvement according to daughter. Her gait is much improved. Her cognitive ability in general energy levels are also. Patient is now eating and drinking on a regular basis. Patient's daughter is helping her with her medication.  PRIOR HPI (11/27/13): 80 year old right-handed female here for NPH evaluation. Patient is alone for this visit. She has limited information about her medical history and her medications. In December 2014 patient had confusion, malaise, weakness, was diagnosed with sepsis. She's been in the hospital for one week. During hospital evaluation it was noted that she had some tremor and had been on carbidopa/levodopa. Apparently before going to the hospital she was evaluated for tremor and diagnosed with possible Parkinson's disease versus NPH. We have no information about this workup or where was done. Patient does not recall seeing a neurologist. Patient then transition to rehabilitation and then skilled  nursing facility. Ultimately she transitioned to [facility] to be closer to her daughter. Now she lives in independent living facility. Patient does not know what medications she takes. Apparently they're set up by her daughter in a pill box. She denies tremor. She has had some walking and balance difficulties over past 3-4 months. She uses a walker at times.  REVIEW OF SYSTEMS: Full 14 system review of systems performed and notable only for memory loss incontinence bladder blurred vision ringing in ears insomnia snoring walking difficulty I redness.    ALLERGIES: Allergies  Allergen Reactions  . Atorvastatin Other (See Comments)    Other Reaction: Other reaction    HOME MEDICATIONS: Outpatient Prescriptions Prior to Visit  Medication Sig Dispense Refill  . levothyroxine (SYNTHROID, LEVOTHROID) 100 MCG tablet TAKE ONE TABLET BY MOUTH ONCE DAILY  30 tablet  0  . polyethylene glycol (MIRALAX / GLYCOLAX) packet Take 17 g by mouth 2 (two) times daily as needed. For constipation      . simvastatin (ZOCOR) 20 MG tablet Take 20 mg by mouth at bedtime. Take 1/2 tablet daily at bedtime for 4 weeks then increase to whole tablet daily at bedtime      . simvastatin (ZOCOR) 20 MG tablet Take one tablet by mouth daily at bedtime for high cholesterol  30 tablet  0  . venlafaxine XR (EFFEXOR-XR) 37.5 MG 24 hr capsule TAKE ONE CAPSULE BY MOUTH ONCE DAILY FOR ANXIETY/ DEPRESSION.  30 capsule  2  . carbidopa-levodopa (SINEMET IR) 25-100 MG per tablet TAKE ONE TABLET BY MOUTH TWICE DAILY  60 tablet  2   No facility-administered medications prior to visit.    80 years old HISTORY: Past Medical History  Diagnosis Date  .  Anxiety   . Depression   . GERD (gastroesophageal reflux disease)   . Vitamin D deficiency disease   . Hypothyroid   . Urinary retention   . Insomnia   . Mild cognitive impairment   . Gait instability   . Sepsis     PAST SURGICAL HISTORY: Past Surgical History  Procedure  Laterality Date  . Abdominal hysterectomy  1980's    FAMILY HISTORY: History reviewed. No pertinent family history.  SOCIAL HISTORY:  History   Social History  . Marital Status: Divorced    Spouse Name: N/A    Number of Children: 3  . Years of Education: Grad sch   Occupational History  . Retired Other   Social History Main Topics  . Smoking status: Never Smoker   . Smokeless tobacco: Never Used  . Alcohol Use: Yes     Comment: maybe wine at christmas   . Drug Use: No  . Sexual Activity: No   Other Topics Concern  . Not on file   Social History Narrative   [place].    Caffeine Use: quit more than 10299yrs ago     PHYSICAL EXAM  Filed Vitals:   12/19/13 1048  BP: 126/78  Pulse: 66  Temp: 97.6 F (36.4 C)  TempSrc: Oral  Height: 5\' 7"  (1.702 m)  Weight: 202 lb (91.627 kg)    Not recorded    Body mass index is 31.63 kg/(m^2).  GENERAL EXAM: Patient is in no distress; well developed, nourished and groomed; neck is supple  CARDIOVASCULAR: Regular rate and rhythm, no murmurs, no carotid bruits  NEUROLOGIC: MENTAL STATUS: awake, alert, language fluent, comprehension intact, naming intact, fund of knowledge appropriate; POSITIVE SNOUT AND MYERSONS.  CRANIAL NERVE: no papilledema on fundoscopic exam, pupils equal and reactive to light, visual fields full to confrontation, extraocular muscles intact, no nystagmus, facial sensation and strength symmetric, hearing intact, palate elevates symmetrically, uvula midline, shoulder shrug symmetric, tongue midline. MOTOR: normal bulk, full strength in the BUE, BLE; NO TREMOR. MILD COGWHEELING AND BRADYKINESIA IN LUE SENSORY: normal and symmetric to light touch; DECR VIB AT TOES (L WORSE THAN R) COORDINATION: finger-nose-finger, fine finger movements SLOW ON LEFT REFLEXES: BUE TRACE, KNEES TRACE, ANKLES 0 GAIT/STATION: narrow based gait. DECR ARM SWING. SMOOTH TURNS. Able to  walk on toes, heels; DIFF WITH TANDEM. Romberg is negative    DIAGNOSTIC DATA (LABS, IMAGING, TESTING) - I reviewed patient records, labs, notes, testing and imaging myself where available.  Lab Results  Component Value Date   WBC 8.1 10/01/2013   HGB 12.4 10/01/2013   HCT 37.9 10/01/2013   MCV 86 10/01/2013   PLT 307 10/01/2013      Component Value Date/Time   NA 142 10/01/2013 0936   NA 142 09/21/2013 0019   K 4.4 10/01/2013 0936   CL 103 10/01/2013 0936   CO2 24 10/01/2013 0936   GLUCOSE 110* 10/01/2013 0936   GLUCOSE 132* 09/21/2013 0019   BUN 22 10/01/2013 0936   BUN 24* 09/21/2013 0019   CREATININE 0.78 10/01/2013 0936   CALCIUM 9.2 10/01/2013 0936   PROT 6.2 10/01/2013 0936   AST 21 10/01/2013 0936   ALT 15 10/01/2013 0936   ALKPHOS 115 10/01/2013 0936   BILITOT 0.2 10/01/2013 0936   GFRNONAA 73 10/01/2013 0936   GFRAA 84 10/01/2013 0936   Lab Results  Component Value Date   HDL 36* 10/03/2013   LDLCALC Comment 10/03/2013  TRIG 447* 10/03/2013   CHOLHDL 7.6* 10/03/2013   Lab Results  Component Value Date   HGBA1C 6.1* 11/27/2013   Lab Results  Component Value Date   VITAMINB12 332 11/27/2013   Lab Results  Component Value Date   TSH 1.850 11/27/2013    I reviewed images myself and agree with interpretation. -VRP  09/21/13 CT HEAD 1. No acute intracranial abnormality.  2. Mild generalized cerebral atrophy with moderate chronic microvascular ischemic disease.  12/04/13 MRI brain (without) demonstrating:  1. Mild perisylvian and moderate mesial temporal atrophy.  2. Moderate periventricular and subcortical chronic small vessel ischemic disease.  3. No acute findings.    ASSESSMENT AND PLAN  80 y.o. year old female here with memory loss, gait decline, incontinence, and transition to assisted living in the past 1 year. Some evidence for underlying neurodegenerative process due to parkinsonian features, frontal release signs and brain atrophy. No evidence of NPH at this time.  Overall some improvement since moving to [place] with supervised medication mgmt and regular meals.  PLAN: - taper carb/levo off to see if it is necessary - monitor sxs  Return in about 6 months (around 06/21/2014).     Suanne Marker, MD 12/19/2013, 12:15 PM Certified in Neurology, Neurophysiology and Neuroimaging  [facility] [address], [address] [place], [place] [address] [phone]

## 2014-01-07 ENCOUNTER — Encounter: Payer: Self-pay | Admitting: Internal Medicine

## 2014-01-07 ENCOUNTER — Ambulatory Visit (INDEPENDENT_AMBULATORY_CARE_PROVIDER_SITE_OTHER): Payer: Medicare Other | Admitting: Internal Medicine

## 2014-01-07 VITALS — BP 122/70 | HR 70 | Temp 98.1°F | Ht 66.5 in | Wt 206.4 lb

## 2014-01-07 DIAGNOSIS — M719 Bursopathy, unspecified: Secondary | ICD-10-CM

## 2014-01-07 DIAGNOSIS — H53489 Generalized contraction of visual field, unspecified eye: Secondary | ICD-10-CM

## 2014-01-07 DIAGNOSIS — R269 Unspecified abnormalities of gait and mobility: Secondary | ICD-10-CM

## 2014-01-07 DIAGNOSIS — G4700 Insomnia, unspecified: Secondary | ICD-10-CM

## 2014-01-07 DIAGNOSIS — F3289 Other specified depressive episodes: Secondary | ICD-10-CM

## 2014-01-07 DIAGNOSIS — F32A Depression, unspecified: Secondary | ICD-10-CM

## 2014-01-07 DIAGNOSIS — R2681 Unsteadiness on feet: Secondary | ICD-10-CM

## 2014-01-07 DIAGNOSIS — M67919 Unspecified disorder of synovium and tendon, unspecified shoulder: Secondary | ICD-10-CM

## 2014-01-07 DIAGNOSIS — H53483 Generalized contraction of visual field, bilateral: Secondary | ICD-10-CM

## 2014-01-07 DIAGNOSIS — M7581 Other shoulder lesions, right shoulder: Secondary | ICD-10-CM

## 2014-01-07 DIAGNOSIS — G3184 Mild cognitive impairment, so stated: Secondary | ICD-10-CM

## 2014-01-07 DIAGNOSIS — F329 Major depressive disorder, single episode, unspecified: Secondary | ICD-10-CM

## 2014-01-07 DIAGNOSIS — Z23 Encounter for immunization: Secondary | ICD-10-CM

## 2014-01-07 MED ORDER — ZOSTER VACCINE LIVE 19400 UNT/0.65ML ~~LOC~~ SOLR: 0.6500 mL | Freq: Once | SUBCUTANEOUS | Status: DC

## 2014-01-07 NOTE — Patient Instructions (Signed)
You may try ibuprofen 600mg  by mouth twice day for just two weeks. Do not take it longer because it can affect your kidneys and stomach. Also, you can try applying ice in a baggy and wrapped in a towel. Over the counter, you can try a topical muscle medication like aspercreme, icy hot, salonpas.

## 2014-01-07 NOTE — Progress Notes (Signed)
Patient ID: Evelyn Robinson, female   DOB: 1932-12-26, 80 y.o.   MRN: [id number]   Location:  [facility]  Code Status: full   Allergies  Allergen Reactions  . Atorvastatin Other (See Comments)    Other Reaction: Other reaction    Chief Complaint  Patient presents with  . Follow-up    3 month f/u (no labs), no refills today  . other    having tunnel vision (cateracts ?, last eye exam 2 months ago @ [facility]), RT shoulder pain (fell 3 month ago & TV fell on her pain since then)    HPI: Patient is a 80 y.o. female seen in the office today for medical mgt of chronic diseases.  She is here with her daughter, April, who manages her medications.  She is Jessica's patient.  She has recently had an eye exam at [facility] 2 mos ago, but has now been having difficulty with tunnel vision.  Feels like vision is focused more in front of her.  Wonders if she doesn't have cataracts.  Says exam was fast at [facility].  Was told in past, that she did have cataracts.  Present all of the time--like hooding on top and sides.    Right shoulder pain since TV fell on her.  May have even started before that.  Had fall out of bed the last week of December and that may have been the cause.  It's sore every day.  Nothing so far is making it better.  It is excruciating when she awakens.  Gets worse when supinates her right arm.  Is able to lift her arm completely but pain, and hurts to bring it back down.  At joint and down upper arm.  Has not done anything for it.    Has gone off klonopin. Sleep cycles are much better.  Does use prn melatonin.  Slept in this am and had to hurry here.  Not napping anymore.  Is like a regular person.  Says her mood is pretty normal now except the thing with her eyes and her shoulder pain.  It does not sound like she has normal pressure hydrocephalus, only some age-related atrophy.  Appears she did not have sleep apnea on sleep study--has not had  the f/u visit for this.    Review of Systems:  Review of Systems  Constitutional: Negative for fever and malaise/fatigue.  HENT: Negative for congestion.   Eyes: Negative for blurred vision.  Respiratory: Negative for shortness of breath.   Cardiovascular: Negative for chest pain and leg swelling.  Gastrointestinal: Negative for abdominal pain, constipation, blood in stool and melena.  Genitourinary: Negative for dysuria.  Musculoskeletal: Positive for joint pain.  Skin: Negative for rash.  Neurological: Negative for dizziness, loss of consciousness and weakness.  Psychiatric/Behavioral: Positive for depression and memory loss. The patient has insomnia. The patient is not nervous/anxious.     Past Medical History  Diagnosis Date  . Anxiety   . Depression   . GERD (gastroesophageal reflux disease)   . Vitamin D deficiency disease   . Hypothyroid   . Urinary retention   . Insomnia   . Mild cognitive impairment   . Gait instability   . Sepsis     Past Surgical History  Procedure Laterality Date  . Abdominal hysterectomy  1980's    Social History:   reports that she has never smoked. She has never used smokeless tobacco. She reports that she drinks alcohol.  She reports that she does not use illicit drugs.  History reviewed. No pertinent family history.  Medications: Patient's Medications  New Prescriptions   No medications on file  Previous Medications   LEVOTHYROXINE (SYNTHROID, LEVOTHROID) 100 MCG TABLET    TAKE ONE TABLET BY MOUTH ONCE DAILY   MELATONIN 3 MG TABS    Take by mouth. Take 1 tablet at night as needed for sleep   OMEPRAZOLE (PRILOSEC) 20 MG CAPSULE    Take 1 capsule by mouth daily.   POLYETHYLENE GLYCOL (MIRALAX / GLYCOLAX) PACKET    Take 17 g by mouth 2 (two) times daily as needed. For constipation   SIMVASTATIN (ZOCOR) 20 MG TABLET    Take 20 mg by mouth at bedtime. Take 1/2 tablet daily at bedtime for 4 weeks then increase to whole tablet daily at  bedtime   SIMVASTATIN (ZOCOR) 20 MG TABLET    Take one tablet by mouth daily at bedtime for high cholesterol   VENLAFAXINE XR (EFFEXOR-XR) 37.5 MG 24 HR CAPSULE    TAKE ONE CAPSULE BY MOUTH ONCE DAILY FOR ANXIETY/ DEPRESSION.  Modified Medications   Modified Medication Previous Medication   ZOSTER VACCINE LIVE, PF, (ZOSTAVAX) [id number] UNT/0.65ML INJECTION zoster vaccine live, PF, (ZOSTAVAX) [id number] UNT/0.65ML injection      Inject 19,400 Units into the skin once.    Inject 0.65 mLs into the skin once.  Discontinued Medications   No medications on file     Physical Exam: Filed Vitals:   01/07/14 1102  BP: 122/70  Pulse: 70  Temp: 98.1 F (36.7 C)  TempSrc: Oral  Height: 5' 6.5" (1.689 m)  Weight: 206 lb 6.4 oz (93.622 kg)  SpO2: 96%  Physical Exam  Constitutional: She is oriented to person, place, and time. She appears well-developed and well-nourished. No distress.  Eyes: EOM are normal. Pupils are equal, round, and reactive to light.  Unable to count fingers in right upper outer quadrant  Cardiovascular: Normal rate, regular rhythm, normal heart sounds and intact distal pulses.   Pulmonary/Chest: Effort normal and breath sounds normal. No respiratory distress.  Abdominal: Soft. Bowel sounds are normal.  Musculoskeletal: Normal range of motion. She exhibits tenderness. She exhibits no edema.  Of right shoulder over anterior and lateral aspects of rotator cuff and worse with supination of arm  Neurological: She is alert and oriented to person, place, and time.  Skin: Skin is warm and dry.  Psychiatric: She has a normal mood and affect.    Labs reviewed: Basic Metabolic Panel:  Recent Labs  09/81/1903/06/08 0019 10/01/13 0936 11/27/13 1025  NA 142 142  --   K 3.8 4.4  --   CL 103 103  --   CO2 23 24  --   GLUCOSE 132* 110*  --   BUN 24* 22  --   CREATININE 0.71 0.78  --   CALCIUM 9.1 9.2  --   TSH  --  1.660 1.850   Liver Function Tests:  Recent Labs  10/01/13 0936  AST  21  ALT 15  ALKPHOS 115  BILITOT 0.2  PROT 6.2  CBC:  Recent Labs  09/21/13 0019 10/01/13 0936  WBC 8.6 8.1  NEUTROABS  --  4.4  HGB 12.8 12.4  HCT 38.7 37.9  MCV 87.6 86  PLT 318 307   Lipid Panel:  Recent Labs  10/01/13 0936 10/03/13 1226  HDL 35* 36*  LDLCALC Comment Comment  TRIG 487* 447*  CHOLHDL  --  7.6*  Lab Results  Component Value Date   HGBA1C 6.1* 11/27/2013   Assessment/Plan 1. Tunnel vision, bilateral -new problem for her -says she may have cataracts -needs peripheral vision assessment--did pretty well with basic assessment today except right upper outer quadrant was unable to count fingers - Ambulatory referral to Ophthalmology  2. Rotator cuff tendonitis, right -given instructions for conservative measures today (see instructions)  3. Depression - stable on effexor and off clonazepam--doing wonderfully  4. Need for prophylactic vaccination and inoculation against other viral diseases(V04.89) -Rx given today - zoster vaccine live, PF, (ZOSTAVAX) 16109 UNT/0.65ML injection; Inject 19,400 Units into the skin once.  Dispense: 1 each; Refill: 0  5. Mild cognitive impairment - seems much improved off clonazepam, but  Needs reasssessment with mmse  6. Gait instability -improved off klonopin  7. Insomnia -better with prn melatonin occasionally  Labs/tests ordered:  None today Next appt:  3 mos with Shanda Bumps

## 2014-01-20 ENCOUNTER — Other Ambulatory Visit: Payer: Self-pay | Admitting: Internal Medicine

## 2014-01-20 ENCOUNTER — Other Ambulatory Visit: Payer: Self-pay | Admitting: Nurse Practitioner

## 2014-02-20 ENCOUNTER — Other Ambulatory Visit: Payer: Self-pay | Admitting: Internal Medicine

## 2014-02-21 ENCOUNTER — Encounter: Payer: Self-pay | Admitting: *Deleted

## 2014-04-11 ENCOUNTER — Ambulatory Visit: Payer: Medicare Other | Admitting: Nurse Practitioner

## 2014-05-10 ENCOUNTER — Telehealth: Payer: Self-pay | Admitting: *Deleted

## 2014-05-10 NOTE — Telephone Encounter (Signed)
Patient daughter, April called and stated that patient needs something to sleep better. Not sleeping well. Stated that she discussed this when she was in August with Dr. Renato Gailseed. Please Advise.

## 2014-05-10 NOTE — Telephone Encounter (Signed)
Patient was to be seen in November by Shanda BumpsJessica.   In August, she was sleeping well with occasional melatonin according to my note.  She needs an appointment.

## 2014-05-13 NOTE — Telephone Encounter (Signed)
LMOM for April to call back and schedule an appointment per Dr. Renato Gailseed.

## 2014-06-19 ENCOUNTER — Telehealth: Payer: Self-pay | Admitting: *Deleted

## 2014-06-19 ENCOUNTER — Telehealth: Payer: Self-pay | Admitting: Diagnostic Neuroimaging

## 2014-06-19 NOTE — Telephone Encounter (Signed)
Patient questioning if she really needs to keep appointment 6 mo fu with Dr. Marjory LiesPenumalli.  Please call and advise.

## 2014-06-20 NOTE — Telephone Encounter (Signed)
Patient daughter returning call in regards to appt.

## 2014-06-20 NOTE — Telephone Encounter (Signed)
Spoke with daughter April on the phone, she states that the pt is doing very well. April said she would call back and reschedule an appt if anything new or worsening occurred or the memory issues seemed to be getting worse.

## 2014-06-24 ENCOUNTER — Ambulatory Visit: Payer: Medicare Other | Admitting: Diagnostic Neuroimaging

## 2014-06-30 ENCOUNTER — Other Ambulatory Visit: Payer: Self-pay | Admitting: Nurse Practitioner

## 2014-07-18 ENCOUNTER — Other Ambulatory Visit: Payer: Self-pay | Admitting: *Deleted

## 2014-07-18 MED ORDER — SIMVASTATIN 20 MG PO TABS: ORAL_TABLET | ORAL | Status: DC

## 2014-08-08 ENCOUNTER — Ambulatory Visit (INDEPENDENT_AMBULATORY_CARE_PROVIDER_SITE_OTHER): Payer: Medicare Other | Admitting: Nurse Practitioner

## 2014-08-08 ENCOUNTER — Encounter: Payer: Self-pay | Admitting: Nurse Practitioner

## 2014-08-08 VITALS — BP 118/80 | HR 74 | Temp 97.3°F | Resp 20 | Ht 67.0 in | Wt 213.8 lb

## 2014-08-08 DIAGNOSIS — Z23 Encounter for immunization: Secondary | ICD-10-CM

## 2014-08-08 DIAGNOSIS — G3184 Mild cognitive impairment, so stated: Secondary | ICD-10-CM

## 2014-08-08 DIAGNOSIS — G4700 Insomnia, unspecified: Secondary | ICD-10-CM

## 2014-08-08 DIAGNOSIS — E785 Hyperlipidemia, unspecified: Secondary | ICD-10-CM | POA: Diagnosis not present

## 2014-08-08 DIAGNOSIS — F419 Anxiety disorder, unspecified: Secondary | ICD-10-CM | POA: Diagnosis not present

## 2014-08-08 MED ORDER — VENLAFAXINE HCL ER 75 MG PO CP24: 75.0000 mg | ORAL_CAPSULE | Freq: Every day | ORAL | Status: DC

## 2014-08-08 NOTE — Patient Instructions (Signed)
Increased activity during the day Get more involved Get in the bed only to sleep Do not nap all day (one small nap is okay) Cont melatonin  Will increase venlafazine to 75 mg daily- take mid day with food  Follow up in 1 month- make early appt and come fasting for more blood work     Insomnia Insomnia is frequent trouble falling and/or staying asleep. Insomnia can be a long term problem or a short term problem. Both are common. Insomnia can be a short term problem when the wakefulness is related to a certain stress or worry. Long term insomnia is often related to ongoing stress during waking hours and/or poor sleeping habits. Overtime, sleep deprivation itself can make the problem worse. Every little thing feels more severe because you are overtired and your ability to cope is decreased. CAUSES   Stress, anxiety, and depression.  Poor sleeping habits.  Distractions such as TV in the bedroom.  Naps close to bedtime.  Engaging in emotionally charged conversations before bed.  Technical reading before sleep.  Alcohol and other sedatives. They may make the problem worse. They can hurt normal sleep patterns and normal dream activity.  Stimulants such as caffeine for several hours prior to bedtime.  Pain syndromes and shortness of breath can cause insomnia.  Exercise late at night.  Changing time zones may cause sleeping problems (jet lag). It is sometimes helpful to have someone observe your sleeping patterns. They should look for periods of not breathing during the night (sleep apnea). They should also look to see how long those periods last. If you live alone or observers are uncertain, you can also be observed at a sleep clinic where your sleep patterns will be professionally monitored. Sleep apnea requires a checkup and treatment. Give your caregivers your medical history. Give your caregivers observations your family has made about your sleep.  SYMPTOMS   Not feeling rested  in the morning.  Anxiety and restlessness at bedtime.  Difficulty falling and staying asleep. TREATMENT   Your caregiver may prescribe treatment for an underlying medical disorders. Your caregiver can give advice or help if you are using alcohol or other drugs for self-medication. Treatment of underlying problems will usually eliminate insomnia problems.  Medications can be prescribed for short time use. They are generally not recommended for lengthy use.  Over-the-counter sleep medicines are not recommended for lengthy use. They can be habit forming.  You can promote easier sleeping by making lifestyle changes such as:  Using relaxation techniques that help with breathing and reduce muscle tension.  Exercising earlier in the day.  Changing your diet and the time of your last meal. No night time snacks.  Establish a regular time to go to bed.  Counseling can help with stressful problems and worry.  Soothing music and white noise may be helpful if there are background noises you cannot remove.  Stop tedious detailed work at least one hour before bedtime. HOME CARE INSTRUCTIONS   Keep a diary. Inform your caregiver about your progress. This includes any medication side effects. See your caregiver regularly. Take note of:  Times when you are asleep.  Times when you are awake during the night.  The quality of your sleep.  How you feel the next day. This information will help your caregiver care for you.  Get out of bed if you are still awake after 15 minutes. Read or do some quiet activity. Keep the lights down. Wait until you feel sleepy and  go back to bed.  Keep regular sleeping and waking hours. Avoid naps.  Exercise regularly.  Avoid distractions at bedtime. Distractions include watching television or engaging in any intense or detailed activity like attempting to balance the household checkbook.  Develop a bedtime ritual. Keep a familiar routine of bathing, brushing  your teeth, climbing into bed at the same time each night, listening to soothing music. Routines increase the success of falling to sleep faster.  Use relaxation techniques. This can be using breathing and muscle tension release routines. It can also include visualizing peaceful scenes. You can also help control troubling or intruding thoughts by keeping your mind occupied with boring or repetitive thoughts like the old concept of counting sheep. You can make it more creative like imagining planting one beautiful flower after another in your backyard garden.  During your day, work to eliminate stress. When this is not possible use some of the previous suggestions to help reduce the anxiety that accompanies stressful situations. MAKE SURE YOU:   Understand these instructions.  Will watch your condition.  Will get help right away if you are not doing well or get worse. Document Released: 05/07/2000 Document Revised: 08/02/2011 Document Reviewed: 06/07/2007 [facility] Patient Information 2015 [employer], [place]. This information is not intended to replace advice given to you by your health care provider. Make sure you discuss any questions you have with your health care provider.

## 2014-08-08 NOTE — Progress Notes (Signed)
Patient ID: Evelyn Robinson, female   DOB: 10-05-1932, 81 y.o.   MRN: [id number]    PCP: Sharon Seller, NP  Allergies  Allergen Reactions  . Atorvastatin Other (See Comments)    Other Reaction: Other reaction    Chief Complaint  Patient presents with  . Acute Visit    Patient c/o sleep issues  x several years. Sleep issues are worse when stressed  . Immunizations    Flu vaccine     HPI: Patient is a 81 y.o. female seen in the office today to follow up sleep. This is a chronic issue has had trouble with insomnia for many years. Lays in bed and feels her heartbeat and then wants to jump up. Very anxious all the time. Taking effexor XR 37.5mg   For 2-3 years, tired multiple medications and this worked the best.  Melatonin not helping at all.  Does not do anything during the day. Was staying in the bed due to being dizzy, going away now.  Does not like being in the home she is in. She does not like being around people with medical problems. Not active Does not do much during the day. Not involved in activities.   Review of Systems:  Review of Systems  Constitutional: Negative for fever.  HENT: Negative for congestion.   Respiratory: Negative for shortness of breath.   Cardiovascular: Negative for chest pain and leg swelling.  Gastrointestinal: Negative for abdominal pain, constipation and blood in stool.  Genitourinary: Negative for dysuria.  Skin: Negative for rash.  Neurological: Negative for dizziness and weakness.  Psychiatric/Behavioral: Positive for sleep disturbance. The patient is not nervous/anxious.        Has depression and memory loss    Past Medical History  Diagnosis Date  . Anxiety   . Depression   . GERD (gastroesophageal reflux disease)   . Vitamin D deficiency disease   . Hypothyroid   . Urinary retention   . Insomnia   . Mild cognitive impairment   . Gait instability   . Sepsis    Past Surgical History  Procedure Laterality Date  . Abdominal  hysterectomy  1980's   Social History:   reports that she has never smoked. She has never used smokeless tobacco. She reports that she drinks alcohol. She reports that she does not use illicit drugs.  History reviewed. No pertinent family history.  Medications: Patient's Medications  New Prescriptions   No medications on file  Previous Medications   LEVOTHYROXINE (SYNTHROID, LEVOTHROID) 100 MCG TABLET    TAKE ONE TABLET BY MOUTH ONCE DAILY   MELATONIN 3 MG TABS    Take by mouth. Take 1 tablet at night as needed for sleep   OMEPRAZOLE (PRILOSEC) 20 MG CAPSULE    TAKE ONE CAPSULE BY MOUTH TWICE DAILY FOR ACID REFLUX   SIMVASTATIN (ZOCOR) 20 MG TABLET    TAKE ONE TABLET BY MOUTH AT BEDTIME FOR HIGH CHOLESTEROL  Modified Medications   Modified Medication Previous Medication   VENLAFAXINE XR (EFFEXOR-XR) 75 MG 24 HR CAPSULE venlafaxine XR (EFFEXOR-XR) 37.5 MG 24 hr capsule      Take 1 capsule (75 mg total) by mouth daily with breakfast.    TAKE ONE CAPSULE BY MOUTH ONCE DAILY FOR ANXIETY/DEPRESSION  Discontinued Medications   LEVOTHYROXINE (SYNTHROID, LEVOTHROID) 100 MCG TABLET    TAKE ONE TABLET BY MOUTH ONCE DAILY   POLYETHYLENE GLYCOL (MIRALAX / GLYCOLAX) PACKET    Take 17 g by mouth 2 (two) times  daily as needed. For constipation   ZOSTER VACCINE LIVE, PF, (ZOSTAVAX) [id number] UNT/0.65ML INJECTION    Inject 19,400 Units into the skin once.     Physical Exam:  Filed Vitals:   08/08/14 1508  BP: 118/80  Pulse: 74  Temp: 97.3 F (36.3 C)  TempSrc: Oral  Resp: 20  Height: 5\' 7"  (1.702 m)  Weight: 213 lb 12.8 oz (96.979 kg)  SpO2: 96%    Physical Exam  Constitutional: She appears well-developed and well-nourished. No distress.  HENT:  Mouth/Throat: Oropharynx is clear and moist. No oropharyngeal exudate.  Eyes: Conjunctivae and EOM are normal. Pupils are equal, round, and reactive to light.  Neck: Normal range of motion. Neck supple.  Cardiovascular: Normal rate, regular rhythm  and normal heart sounds.   Pulmonary/Chest: Effort normal and breath sounds normal.  Abdominal: Soft. Bowel sounds are normal.  Musculoskeletal: She exhibits no edema or tenderness.  Neurological: She is alert.  Skin: Skin is warm and dry.  Psychiatric: She has a normal mood and affect.    Labs reviewed: Basic Metabolic Panel:  Recent Labs  32/44/103/06/08 0019 10/01/13 0936 11/27/13 1025 08/08/14 1623  NA 142 142  --  140  K 3.8 4.4  --  4.2  CL 103 103  --  100  CO2 23 24  --  25  GLUCOSE 132* 110*  --  117*  BUN 24* 22  --  15  CREATININE 0.71 0.78  --  0.82  CALCIUM 9.1 9.2  --  9.1  TSH  --  1.660 1.850 1.290   Liver Function Tests:  Recent Labs  10/01/13 0936 08/08/14 1623  AST 21 14  ALT 15 15  ALKPHOS 115 106  BILITOT 0.2 0.5  PROT 6.2 6.7   No results for input(s): LIPASE, AMYLASE in the last 8760 hours. No results for input(s): AMMONIA in the last 8760 hours. CBC:  Recent Labs  09/21/13 0019 10/01/13 0936 08/08/14 1623  WBC 8.6 8.1 9.8  NEUTROABS  --  4.4 6.5  HGB 12.8 12.4 13.8  HCT 38.7 37.9 41.7  MCV 87.6 86 89  PLT 318 307 343   Lipid Panel:  Recent Labs  10/01/13 0936 10/03/13 1226 08/08/14 1623  CHOL 241* 275* 210*  HDL 35* 36* 42  LDLCALC Comment Comment 103*  TRIG 487* 447* 323*  CHOLHDL  --  7.6* 5.0*   TSH:  Recent Labs  10/01/13 0936 11/27/13 1025 08/08/14 1623  TSH 1.660 1.850 1.290   A1C: Lab Results  Component Value Date   HGBA1C 6.1* 11/27/2013     Assessment/Plan  1. Encounter for immunization -flu vaccine given.  2. Anxiety -worsening anxiety and depression  -encouraged pt to be more involved, for family to help encourage her.  Counseling along with medication for best results  - will increase venlafaxine XR (EFFEXOR-XR) 75 MG 24 hr capsule; Take 1 capsule (75 mg total) by mouth daily with breakfast.  Dispense: 30 capsule; Refill: 1 - CBC with Differential/Platelet - Lipid Panel - TSH  3.  Insomnia -educated on getting involved in activities, doing something during the day to make her more fatigued at night. -no naps during the day - to use bed only for sleep  - CBC with Differential/Platelet - Lipid Panel - TSH  4. Mild cognitive impairment -not currently on medications  -MMSE 29/30 in may 2015 - CBC with Differential/Platelet - Lipid Panel - TSH  5. Hyperlipemia -conts on zocor, labs past due will get  today - Comprehensive metabolic panel - CBC with Differential/Platelet - Lipid Panel - TSH  Follow up in 1 month

## 2014-08-09 LAB — LIPID PANEL
CHOL/HDL RATIO: 5 ratio — AB (ref 0.0–4.4)
CHOLESTEROL TOTAL: 210 mg/dL — AB (ref 100–199)
HDL: 42 mg/dL (ref >39)
LDL Calculated: 103 mg/dL — ABNORMAL HIGH (ref 0–99)
Triglycerides: 323 mg/dL — ABNORMAL HIGH (ref 0–149)
VLDL CHOLESTEROL CAL: 65 mg/dL — AB (ref 5–40)

## 2014-08-09 LAB — CBC WITH DIFFERENTIAL/PLATELET
Basophils Absolute: 0 10*3/uL (ref 0.0–0.2)
Basos: 0 %
EOS: 1 %
Eosinophils Absolute: 0.1 10*3/uL (ref 0.0–0.4)
HCT: 41.7 % (ref 34.0–46.6)
Hemoglobin: 13.8 g/dL (ref 11.1–15.9)
IMMATURE GRANS (ABS): 0 10*3/uL (ref 0.0–0.1)
IMMATURE GRANULOCYTES: 0 %
LYMPHS: 24 %
Lymphocytes Absolute: 2.4 10*3/uL (ref 0.7–3.1)
MCH: 29.5 pg (ref 26.6–33.0)
MCHC: 33.1 g/dL (ref 31.5–35.7)
MCV: 89 fL (ref 79–97)
MONOCYTES: 7 %
Monocytes Absolute: 0.7 10*3/uL (ref 0.1–0.9)
NEUTROS PCT: 68 %
Neutrophils Absolute: 6.5 10*3/uL (ref 1.4–7.0)
PLATELETS: 343 10*3/uL (ref 150–379)
RBC: 4.68 x10E6/uL (ref 3.77–5.28)
RDW: 14.6 % (ref 12.3–15.4)
WBC: 9.8 10*3/uL (ref 3.4–10.8)

## 2014-08-09 LAB — COMPREHENSIVE METABOLIC PANEL
ALT: 15 IU/L (ref 0–32)
AST: 14 IU/L (ref 0–40)
Albumin/Globulin Ratio: 1.8 (ref 1.1–2.5)
Albumin: 4.3 g/dL (ref 3.5–4.8)
Alkaline Phosphatase: 106 IU/L (ref 39–117)
BUN/Creatinine Ratio: 18 (ref 11–26)
BUN: 15 mg/dL (ref 8–27)
Bilirubin Total: 0.5 mg/dL (ref 0.0–1.2)
CO2: 25 mmol/L (ref 18–29)
CREATININE: 0.82 mg/dL (ref 0.57–1.00)
Calcium: 9.1 mg/dL (ref 8.7–10.3)
Chloride: 100 mmol/L (ref 97–108)
GFR calc Af Amer: 79 mL/min/{1.73_m2} (ref >59)
GFR, EST NON AFRICAN AMERICAN: 68 mL/min/{1.73_m2} (ref >59)
GLOBULIN, TOTAL: 2.4 g/dL (ref 1.5–4.5)
Glucose: 117 mg/dL — ABNORMAL HIGH (ref 65–99)
Potassium: 4.2 mmol/L (ref 3.5–5.2)
Sodium: 140 mmol/L (ref 134–144)
Total Protein: 6.7 g/dL (ref 6.0–8.5)

## 2014-08-09 LAB — TSH: TSH: 1.29 u[IU]/mL (ref 0.450–4.500)

## 2014-09-14 ENCOUNTER — Emergency Department (HOSPITAL_COMMUNITY)
Admission: EM | Admit: 2014-09-14 | Discharge: 2014-09-14 | Disposition: A | Discharge status: Home / Self Care | Payer: Medicare Other | Attending: Emergency Medicine | Admitting: Emergency Medicine

## 2014-09-14 ENCOUNTER — Encounter (HOSPITAL_COMMUNITY): Payer: Self-pay | Admitting: Emergency Medicine

## 2014-09-14 DIAGNOSIS — Z8719 Personal history of other diseases of the digestive system: Secondary | ICD-10-CM | POA: Diagnosis not present

## 2014-09-14 DIAGNOSIS — R42 Dizziness and giddiness: Secondary | ICD-10-CM | POA: Insufficient documentation

## 2014-09-14 DIAGNOSIS — Z8619 Personal history of other infectious and parasitic diseases: Secondary | ICD-10-CM | POA: Insufficient documentation

## 2014-09-14 DIAGNOSIS — E039 Hypothyroidism, unspecified: Secondary | ICD-10-CM | POA: Diagnosis not present

## 2014-09-14 DIAGNOSIS — Z8669 Personal history of other diseases of the nervous system and sense organs: Secondary | ICD-10-CM | POA: Insufficient documentation

## 2014-09-14 DIAGNOSIS — T43215A Adverse effect of selective serotonin and norepinephrine reuptake inhibitors, initial encounter: Secondary | ICD-10-CM | POA: Diagnosis present

## 2014-09-14 DIAGNOSIS — Z79899 Other long term (current) drug therapy: Secondary | ICD-10-CM | POA: Insufficient documentation

## 2014-09-14 DIAGNOSIS — Z8659 Personal history of other mental and behavioral disorders: Secondary | ICD-10-CM | POA: Diagnosis not present

## 2014-09-14 LAB — CBC WITH DIFFERENTIAL/PLATELET
BASOS ABS: 0 10*3/uL (ref 0.0–0.1)
Basophils Relative: 0 % (ref 0–1)
EOS ABS: 0.2 10*3/uL (ref 0.0–0.7)
EOS PCT: 2 % (ref 0–5)
HCT: 39 % (ref 36.0–46.0)
Hemoglobin: 12.7 g/dL (ref 12.0–15.0)
LYMPHS ABS: 2.3 10*3/uL (ref 0.7–4.0)
Lymphocytes Relative: 28 % (ref 12–46)
MCH: 29.5 pg (ref 26.0–34.0)
MCHC: 32.6 g/dL (ref 30.0–36.0)
MCV: 90.5 fL (ref 78.0–100.0)
Monocytes Absolute: 0.7 10*3/uL (ref 0.1–1.0)
Monocytes Relative: 8 % (ref 3–12)
NEUTROS PCT: 62 % (ref 43–77)
Neutro Abs: 5.2 10*3/uL (ref 1.7–7.7)
PLATELETS: 306 10*3/uL (ref 150–400)
RBC: 4.31 MIL/uL (ref 3.87–5.11)
RDW: 14 % (ref 11.5–15.5)
WBC: 8.3 10*3/uL (ref 4.0–10.5)

## 2014-09-14 LAB — COMPREHENSIVE METABOLIC PANEL
ALK PHOS: 84 U/L (ref 39–117)
ALT: 28 U/L (ref 0–35)
AST: 31 U/L (ref 0–37)
Albumin: 3.9 g/dL (ref 3.5–5.2)
Anion gap: 8 (ref 5–15)
BILIRUBIN TOTAL: 0.5 mg/dL (ref 0.3–1.2)
BUN: 24 mg/dL — AB (ref 6–23)
CO2: 24 mmol/L (ref 19–32)
Calcium: 8.7 mg/dL (ref 8.4–10.5)
Chloride: 106 mmol/L (ref 96–112)
Creatinine, Ser: 0.8 mg/dL (ref 0.50–1.10)
GFR calc Af Amer: 79 mL/min — ABNORMAL LOW (ref >90)
GFR calc non Af Amer: 68 mL/min — ABNORMAL LOW (ref >90)
GLUCOSE: 145 mg/dL — AB (ref 70–99)
POTASSIUM: 3.9 mmol/L (ref 3.5–5.1)
Sodium: 138 mmol/L (ref 135–145)
TOTAL PROTEIN: 6.6 g/dL (ref 6.0–8.3)

## 2014-09-14 MED ORDER — MECLIZINE HCL 12.5 MG PO TABS: 12.5000 mg | ORAL_TABLET | Freq: Three times a day (TID) | ORAL | Status: AC | PRN

## 2014-09-14 MED ORDER — MECLIZINE HCL 25 MG PO TABS
12.5000 mg | ORAL_TABLET | Freq: Once | ORAL | Status: AC
  Administered 2014-09-14: 12.5 mg via ORAL
  Filled 2014-09-14: qty 1

## 2014-09-14 NOTE — ED Notes (Signed)
MD at bedside. 

## 2014-09-14 NOTE — ED Notes (Signed)
Pt states that for the past 3 wks, after having her effexor increased by double, she has been having foggy thinking, intermittent dizziness, trouble focusing, and just "not feeling well".  Pt brought the list of side effects of this medication and has underlined all the symptoms she has.  Pt was ambulatory to triage room, has no slurred speech, steady gait, equal grip strengths, and symmetrical facial features.

## 2014-09-14 NOTE — Discharge Instructions (Signed)

## 2014-09-14 NOTE — ED Provider Notes (Signed)
CSN: [id number]     Arrival date & time 09/14/14  1624 History   First MD Initiated Contact with Patient 09/14/14 1711     Chief Complaint  Patient presents with  . Medication Reaction     (Consider location/radiation/quality/duration/timing/severity/associated sxs/prior Treatment) HPI Comments: Patient here complaining of 3 weeks of diplopia and foggy thinking with dizziness is worse with positions. Symptoms began after her dose of Effexor was doubled. She denies any headache. No confusion. No weakness to one side of her body. She also notes right ear pain. Denies any black or bloody stools. No abdominal pain or chest pain. No vomiting. Symptoms have been progressively worse and contacted her doctor who told her to come in for further evaluation. Nothing makes them better. No treatment used prior to arrival for this.  The history is provided by the patient.    Past Medical History  Diagnosis Date  . Anxiety   . Depression   . GERD (gastroesophageal reflux disease)   . Vitamin D deficiency disease   . Hypothyroid   . Urinary retention   . Insomnia   . Mild cognitive impairment   . Gait instability   . Sepsis    Past Surgical History  Procedure Laterality Date  . Abdominal hysterectomy  1980's   No family history on file. History  Substance Use Topics  . Smoking status: Never Smoker   . Smokeless tobacco: Never Used  . Alcohol Use: Yes     Comment: maybe wine at christmas    OB History    No data available     Review of Systems  All other systems reviewed and are negative.     Allergies  Atorvastatin  Home Medications   Prior to Admission medications   Medication Sig Start Date End Date Taking? Authorizing Provider  levothyroxine (SYNTHROID, LEVOTHROID) 100 MCG tablet TAKE ONE TABLET BY MOUTH ONCE DAILY    Tiffany L Reed, DO  Melatonin 3 MG TABS Take by mouth. Take 1 tablet at night as needed for sleep    Historical Provider, MD  omeprazole (PRILOSEC) 20 MG  capsule TAKE ONE CAPSULE BY MOUTH TWICE DAILY FOR ACID REFLUX 02/21/14   Tiffany L Reed, DO  simvastatin (ZOCOR) 20 MG tablet TAKE ONE TABLET BY MOUTH AT BEDTIME FOR HIGH CHOLESTEROL 07/18/14   Sharon Seller, NP  venlafaxine XR (EFFEXOR-XR) 75 MG 24 hr capsule Take 1 capsule (75 mg total) by mouth daily with breakfast. 08/08/14   Sharon Seller, NP   BP 157/84 mmHg  Pulse 87  Temp(Src) 98 F (36.7 C) (Oral)  Resp 16  SpO2 93% Physical Exam  Constitutional: She is oriented to person, place, and time. She appears well-developed and well-nourished.  Non-toxic appearance. No distress.  HENT:  Head: Normocephalic and atraumatic.  Eyes: Conjunctivae, EOM and lids are normal. Pupils are equal, round, and reactive to light.  Neck: Normal range of motion. Neck supple. No tracheal deviation present. No thyroid mass present.  Cardiovascular: Normal rate, regular rhythm and normal heart sounds.  Exam reveals no gallop.   No murmur heard. Pulmonary/Chest: Effort normal and breath sounds normal. No stridor. No respiratory distress. She has no decreased breath sounds. She has no wheezes. She has no rhonchi. She has no rales.  Abdominal: Soft. Normal appearance and bowel sounds are normal. She exhibits no distension. There is no tenderness. There is no rebound and no CVA tenderness.  Musculoskeletal: Normal range of motion. She exhibits no edema or tenderness.  Neurological: She is alert and oriented to person, place, and time. She has normal strength. No cranial nerve deficit or sensory deficit. GCS eye subscore is 4. GCS verbal subscore is 5. GCS motor subscore is 6.  nystagmus  Skin: Skin is warm and dry. No abrasion and no rash noted.  Psychiatric: She has a normal mood and affect. Her speech is normal and behavior is normal.  Nursing note and vitals reviewed.   ED Course  Procedures (including critical care time) Labs Review Labs Reviewed  CBC WITH DIFFERENTIAL/PLATELET  COMPREHENSIVE  METABOLIC PANEL    Imaging Review No results found.   EKG Interpretation   Date/Time:  Saturday September 14 2014 17:45:24 EDT Ventricular Rate:  82 PR Interval:  176 QRS Duration: 74 QT Interval:  391 QTC Calculation: 457 R Axis:   8 Text Interpretation:  Sinus rhythm Low voltage, precordial leads Probable  anteroseptal infarct, old Confirmed by Adeleigh Barletta  MD, Constancia Geeting ([id number]) on  09/14/2014 6:53:02 PM      MDM   Final diagnoses:  None    She given Antivert he feels better. Suspect that she either has not vertical and/or medication reaction. Will place her on regimen of Antivert and have instructed her to follow-up with her Dr.    Lorre NickAnthony Bryonna Sundby, MD 09/14/14 1944

## 2014-09-16 ENCOUNTER — Ambulatory Visit (INDEPENDENT_AMBULATORY_CARE_PROVIDER_SITE_OTHER): Payer: Medicare Other | Admitting: Nurse Practitioner

## 2014-09-16 ENCOUNTER — Encounter: Payer: Self-pay | Admitting: Nurse Practitioner

## 2014-09-16 VITALS — BP 118/76 | HR 91 | Temp 98.6°F | Wt 218.0 lb

## 2014-09-16 DIAGNOSIS — G4700 Insomnia, unspecified: Secondary | ICD-10-CM | POA: Diagnosis not present

## 2014-09-16 DIAGNOSIS — F419 Anxiety disorder, unspecified: Secondary | ICD-10-CM

## 2014-09-16 DIAGNOSIS — R42 Dizziness and giddiness: Secondary | ICD-10-CM | POA: Diagnosis not present

## 2014-09-16 DIAGNOSIS — E785 Hyperlipidemia, unspecified: Secondary | ICD-10-CM

## 2014-09-16 MED ORDER — ZOSTER VACCINE LIVE 19400 UNT/0.65ML ~~LOC~~ SOLR: 0.6500 mL | Freq: Once | SUBCUTANEOUS | Status: DC

## 2014-09-16 MED ORDER — VENLAFAXINE HCL ER 37.5 MG PO CP24: 37.5000 mg | ORAL_CAPSULE | Freq: Every day | ORAL | Status: DC

## 2014-09-16 MED ORDER — TRAZODONE HCL 50 MG PO TABS: 25.0000 mg | ORAL_TABLET | Freq: Every evening | ORAL | Status: DC | PRN

## 2014-09-16 NOTE — Patient Instructions (Addendum)
Reminder- Bring copy of Health Care Power of [place]

## 2014-09-16 NOTE — Progress Notes (Signed)
Patient ID: Evelyn Robinson, female   DOB: [birth year], 81 y.o.   MRN: [id number]    PCP: Sharon Seller, NP  Allergies  Allergen Reactions  . Atorvastatin Other (See Comments)    Other Reaction: Other reaction    Chief Complaint  Patient presents with  . Medical Management of Chronic Issues    1 month follow-up, discuss Effexor   . Follow-up    ED follow-up on dizziness   . Immunizations    Discuss the need for pneumonia     HPI: Patient is a 81 y.o. female seen in the office today to follow up ED visit, went to ED due to dizziness and to follow up on anxiety and depression. Pt reports that dizziness started when she went up on Effexor, gradually got worse.  Now back on 37.5 mg of Effexor and feels like dizziness has improved a little bit. Able to at least stand up which before she was unable to do. Has been on effexor for around 2 years, was tried on multiple medications prior with side effects and she was unable to tolerate medications due to dizziness.  Anxiety is worse over the fact she can not do anything.  Also has started benadryl since she was last seen, has helped with sleep when nothing else has.   Review of Systems:  Review of Systems  Constitutional: Negative for fever.  HENT: Negative for congestion.   Respiratory: Negative for shortness of breath.   Cardiovascular: Negative for chest pain and leg swelling.  Gastrointestinal: Negative for abdominal pain, constipation and blood in stool.  Genitourinary: Negative for dysuria.  Skin: Negative for rash.  Neurological: Negative for dizziness and weakness.  Psychiatric/Behavioral: Positive for sleep disturbance. The patient is not nervous/anxious.        Has depression and memory loss    Past Medical History  Diagnosis Date  . Anxiety   . Depression   . GERD (gastroesophageal reflux disease)   . Vitamin D deficiency disease   . Hypothyroid   . Urinary retention   . Insomnia   . Mild cognitive impairment     . Gait instability   . Sepsis    Past Surgical History  Procedure Laterality Date  . Abdominal hysterectomy  1980's   Social History:   reports that she has never smoked. She has never used smokeless tobacco. She reports that she drinks alcohol. She reports that she does not use illicit drugs.  History reviewed. No pertinent family history.  Medications: Patient's Medications  New Prescriptions   No medications on file  Previous Medications   LEVOTHYROXINE (SYNTHROID, LEVOTHROID) 100 MCG TABLET    TAKE ONE TABLET BY MOUTH ONCE DAILY   MECLIZINE (ANTIVERT) 12.5 MG TABLET    Take 1 tablet (12.5 mg total) by mouth 3 (three) times daily as needed for dizziness.   MELATONIN 3 MG TABS    Take 1 tablet by mouth at bedtime.    OMEPRAZOLE (PRILOSEC) 20 MG CAPSULE    TAKE ONE CAPSULE BY MOUTH TWICE DAILY FOR ACID REFLUX   SIMVASTATIN (ZOCOR) 20 MG TABLET    TAKE ONE TABLET BY MOUTH AT BEDTIME FOR HIGH CHOLESTEROL   VENLAFAXINE XR (EFFEXOR-XR) 75 MG 24 HR CAPSULE    Take 1 capsule (75 mg total) by mouth daily with breakfast.   ZOSTER VACCINE LIVE, PF, (ZOSTAVAX) 40981 UNT/0.65ML INJECTION    Inject 0.65 mLs into the skin once.  Modified Medications   No medications on file  Discontinued Medications   No medications on file     Physical Exam:  Filed Vitals:   09/16/14 1448  BP: 118/76  Pulse: 91  Temp: 98.6 F (37 C)  TempSrc: Oral  Weight: 218 lb (98.884 kg)  SpO2: 95%    Physical Exam  Constitutional: She appears well-developed and well-nourished. No distress.  HENT:  Mouth/Throat: Oropharynx is clear and moist. No oropharyngeal exudate.  Eyes: Conjunctivae and EOM are normal. Pupils are equal, round, and reactive to light.  Neck: Normal range of motion. Neck supple.  Cardiovascular: Normal rate, regular rhythm and normal heart sounds.   Pulmonary/Chest: Effort normal and breath sounds normal.  Abdominal: Soft. Bowel sounds are normal.  Musculoskeletal: She exhibits no  edema or tenderness.  Neurological: She is alert.  Skin: Skin is warm and dry.  Psychiatric: She has a normal mood and affect.    Labs reviewed: Basic Metabolic Panel:  Recent Labs  78/29/5604/04/07 0936 11/27/13 1025 08/08/14 1623 09/14/14 1817  NA 142  --  140 138  K 4.4  --  4.2 3.9  CL 103  --  100 106  CO2 24  --  25 24  GLUCOSE 110*  --  117* 145*  BUN 22  --  15 24*  CREATININE 0.78  --  0.82 0.80  CALCIUM 9.2  --  9.1 8.7  TSH 1.660 1.850 1.290  --    Liver Function Tests:  Recent Labs  10/01/13 0936 08/08/14 1623 09/14/14 1817  AST 21 14 31   ALT 15 15 28   ALKPHOS 115 106 84  BILITOT 0.2 0.5 0.5  PROT 6.2 6.7 6.6  ALBUMIN  --   --  3.9   No results for input(s): LIPASE, AMYLASE in the last 8760 hours. No results for input(s): AMMONIA in the last 8760 hours. CBC:  Recent Labs  10/01/13 0936 08/08/14 1623 09/14/14 1817  WBC 8.1 9.8 8.3  NEUTROABS 4.4 6.5 5.2  HGB 12.4 13.8 12.7  HCT 37.9 41.7 39.0  MCV 86 89 90.5  PLT 307 343 306   Lipid Panel:  Recent Labs  10/01/13 0936 10/03/13 1226 08/08/14 1623  CHOL 241* 275* 210*  HDL 35* 36* 42  LDLCALC Comment Comment 103*  TRIG 487* 447* 323*  CHOLHDL  --  7.6* 5.0*   TSH:  Recent Labs  10/01/13 0936 11/27/13 1025 08/08/14 1623  TSH 1.660 1.850 1.290   A1C: Lab Results  Component Value Date   HGBA1C 6.1* 11/27/2013     Assessment/Plan  1. Anxiety -will reduce dose of effexor due to increase in dizziness that may be related to increase however she is also taking benadryl which could also effect dizziness - venlafaxine XR (EFFEXOR-XR) 37.5 MG 24 hr capsule; Take 1 capsule (37.5 mg total) by mouth daily with breakfast.  Dispense: 30 capsule; Refill: 3  2. Insomnia -currently taking benadryl for sleep with good results however discussed side effects related to medication, she will stop taking at this time (also could be adding to dizziness) -will start traZODone (DESYREL) 50 MG tablet;  Take 0.5-1 tablets (25-50 mg total) by mouth at bedtime as needed for sleep.  Dispense: 30 tablet; Refill: 3  3. Hyperlipidemia -allergy to atorvastatin encourage dietary modifications, willing to make changes -to talk with RD at assisted living and chose heart healthy options.   4. Dizziness -ongoing, worse since she increased Effexor however she also added benadryl which could have some effects -evaluated in ED, recommended meclizine however she  does not wish to take at this time. -pt to stop benadryl and will reduce dose of Effexor  Follow up in 4-6 weeks

## 2014-10-14 ENCOUNTER — Ambulatory Visit: Payer: Medicare Other | Admitting: Nurse Practitioner

## 2014-10-24 ENCOUNTER — Encounter: Payer: Self-pay | Admitting: Nurse Practitioner

## 2014-10-24 ENCOUNTER — Ambulatory Visit (INDEPENDENT_AMBULATORY_CARE_PROVIDER_SITE_OTHER): Payer: Medicare Other | Admitting: Nurse Practitioner

## 2014-10-24 VITALS — BP 118/76 | HR 69 | Temp 97.3°F | Resp 20 | Ht 67.0 in | Wt 219.0 lb

## 2014-10-24 DIAGNOSIS — F419 Anxiety disorder, unspecified: Secondary | ICD-10-CM

## 2014-10-24 DIAGNOSIS — R42 Dizziness and giddiness: Secondary | ICD-10-CM

## 2014-10-24 DIAGNOSIS — G4700 Insomnia, unspecified: Secondary | ICD-10-CM

## 2014-10-24 DIAGNOSIS — N3281 Overactive bladder: Secondary | ICD-10-CM | POA: Diagnosis not present

## 2014-10-24 MED ORDER — SOLIFENACIN SUCCINATE 5 MG PO TABS: 5.0000 mg | ORAL_TABLET | Freq: Every day | ORAL | Status: DC

## 2014-10-24 MED ORDER — TRAZODONE HCL 50 MG PO TABS: 50.0000 mg | ORAL_TABLET | Freq: Every day | ORAL | Status: DC

## 2014-10-24 NOTE — Patient Instructions (Signed)
To start vesicare 5 mg daily for overactive bladder  Only get in the bed for sleep increased activity during the day- chair exercises start with 5 mins twice daily and work up to 15 mins

## 2014-10-24 NOTE — Progress Notes (Signed)
Patient ID: Evelyn Robinson, female   DOB: 04-17-1933, 81 y.o.   MRN: [id number]    PCP: Sharon SellerEUBANKS, Reva Pinkley K, NP  Allergies  Allergen Reactions  . Atorvastatin Other (See Comments)    Other Reaction: Other reaction    Chief Complaint  Patient presents with  . Follow-up    4 week follow-up     HPI: Patient is a 81 y.o. female seen in the office today to follow up dizziness, insomnia, and anxiety At last visit pt had went to ED due to dizziness which pt related to Effexor. Reduced Effexor and dizziness improved but anxiety worsened. Pt was also taking benadryl and advised to stop using. Pt was started on trazodone for mood and sleep  Pt still dizzy has not tried meclizine, has not done any vestibular rehab either  Staying in bed most the time. Getting up to eat, goes to the bathroom. No falls Has followed with neurologist who said there was nothing of concern per daughter Been taking trazodone, which was no good. Starting with 25 mg but last week increased to 50 mg daily  Overactive bladder for many years also making sleep difficult. Incontinence while in bed, can not get out of the bed fast enough to get to the rest    Review of Systems:  Review of Systems  Constitutional: Negative for fever.  HENT: Negative for congestion.   Respiratory: Negative for shortness of breath.   Cardiovascular: Negative for chest pain and leg swelling.  Gastrointestinal: Negative for abdominal pain, constipation and blood in stool.  Genitourinary: Negative for dysuria.  Skin: Negative for rash.  Neurological: Negative for dizziness and weakness.  Psychiatric/Behavioral: Positive for sleep disturbance. The patient is not nervous/anxious.        Has depression and memory loss    Past Medical History  Diagnosis Date  . Anxiety   . Depression   . GERD (gastroesophageal reflux disease)   . Vitamin D deficiency disease   . Hypothyroid   . Urinary retention   . Insomnia   . Mild cognitive  impairment   . Gait instability   . Sepsis    Past Surgical History  Procedure Laterality Date  . Abdominal hysterectomy  1980's   Social History:   reports that she has never smoked. She has never used smokeless tobacco. She reports that she drinks alcohol. She reports that she does not use illicit drugs.  History reviewed. No pertinent family history.  Medications: Patient's Medications  New Prescriptions   No medications on file  Previous Medications   LEVOTHYROXINE (SYNTHROID, LEVOTHROID) 100 MCG TABLET    TAKE ONE TABLET BY MOUTH ONCE DAILY   MECLIZINE (ANTIVERT) 12.5 MG TABLET    Take 1 tablet (12.5 mg total) by mouth 3 (three) times daily as needed for dizziness.   MELATONIN 3 MG TABS    Take 1 tablet by mouth at bedtime.    OMEPRAZOLE (PRILOSEC) 20 MG CAPSULE    TAKE ONE CAPSULE BY MOUTH TWICE DAILY FOR ACID REFLUX   SIMVASTATIN (ZOCOR) 20 MG TABLET    TAKE ONE TABLET BY MOUTH AT BEDTIME FOR HIGH CHOLESTEROL   TRAZODONE (DESYREL) 50 MG TABLET    Take 0.5-1 tablets (25-50 mg total) by mouth at bedtime as needed for sleep.   VENLAFAXINE XR (EFFEXOR-XR) 37.5 MG 24 HR CAPSULE    Take 1 capsule (37.5 mg total) by mouth daily with breakfast.   ZOSTER VACCINE LIVE, PF, (ZOSTAVAX) [id number] UNT/0.65ML INJECTION  Inject 19,400 Units into the skin once.  Modified Medications   No medications on file  Discontinued Medications   No medications on file     Physical Exam:  Filed Vitals:   10/24/14 1024  BP: 118/76  Pulse: 69  Temp: 97.3 F (36.3 C)  TempSrc: Oral  Resp: 20  Height:  (1.702 m)  Weight: 219 lb (99.338 kg)  SpO2: 94%    Physical Exam  Constitutional: She appears well-developed and well-nourished. No distress.  HENT:  Mouth/Throat: Oropharynx is clear and moist. No oropharyngeal exudate.  Eyes: Conjunctivae and EOM are normal. Pupils are equal, round, and reactive to light.  Neck: Normal range of motion. Neck supple.  Cardiovascular: Normal rate,  regular rhythm and normal heart sounds.   Pulmonary/Chest: Effort normal and breath sounds normal.  Abdominal: Soft. Bowel sounds are normal.  Musculoskeletal: She exhibits no edema or tenderness.  Neurological: She is alert.  Skin: Skin is warm and dry.  Psychiatric: She has a normal mood and affect.    Labs reviewed: Basic Metabolic Panel:  Recent Labs  04/54/09 1025 08/08/14 1623 09/14/14 1817  NA  --  140 138  K  --  4.2 3.9  CL  --  100 106  CO2  --  25 24  GLUCOSE  --  117* 145*  BUN  --  15 24*  CREATININE  --  0.82 0.80  CALCIUM  --  9.1 8.7  TSH 1.850 1.290  --    Liver Function Tests:  Recent Labs  08/08/14 1623 09/14/14 1817  AST 14 31  ALT 15 28  ALKPHOS 106 84  BILITOT 0.5 0.5  PROT 6.7 6.6  ALBUMIN  --  3.9   No results for input(s): LIPASE, AMYLASE in the last 8760 hours. No results for input(s): AMMONIA in the last 8760 hours. CBC:  Recent Labs  08/08/14 1623 09/14/14 1817  WBC 9.8 8.3  NEUTROABS 6.5 5.2  HGB 13.8 12.7  HCT 41.7 39.0  MCV 89 90.5  PLT 343 306   Lipid Panel:  Recent Labs  08/08/14 1623  CHOL 210*  HDL 42  LDLCALC 103*  TRIG 323*  CHOLHDL 5.0*   TSH:  Recent Labs  11/27/13 1025 08/08/14 1623  TSH 1.850 1.290   A1C: Lab Results  Component Value Date   HGBA1C 6.1* 11/27/2013     Assessment/Plan  1. Insomnia -pt staying in the bed a lot, decreased activity which all increase insomnia -discussed sleep routine, getting up and only using the bed to sleep -increase activity during the day, chair exercises, to slowly increase to 15 min twice daily -to cont traZODone (DESYREL) 50 MG tablet; Take 1 tablet (50 mg total) by mouth at bedtime.  Dispense: 30 tablet; Refill: 3  2. Anxiety -ongoing anxiety and depression, will cont on trazodone 50 mg nightly for sleep and Effexor daily -encouraged to get OOB more with increase activity -will cont to monitor  3. Dizziness -ongoing issue, has not taking  meclizine, to take TID as needed  -pt has not had any therapy for dizziness, neurology could not find reason for being dizzy - will Ambulatory referral to Home Health PT for vestibular rehab  4. Overactive bladder -with a mix of stress and urge incontinence.  - solifenacin (VESICARE) 5 MG tablet; Take 1 tablet (5 mg total) by mouth daily to help with symptoms   Follow up in 2 months, sooner if needed

## 2014-10-28 DIAGNOSIS — G3184 Mild cognitive impairment, so stated: Secondary | ICD-10-CM | POA: Diagnosis not present

## 2014-10-28 DIAGNOSIS — H8110 Benign paroxysmal vertigo, unspecified ear: Secondary | ICD-10-CM | POA: Diagnosis not present

## 2014-10-28 DIAGNOSIS — F329 Major depressive disorder, single episode, unspecified: Secondary | ICD-10-CM | POA: Diagnosis not present

## 2014-10-28 DIAGNOSIS — F419 Anxiety disorder, unspecified: Secondary | ICD-10-CM | POA: Diagnosis not present

## 2014-11-10 ENCOUNTER — Other Ambulatory Visit: Payer: Self-pay | Admitting: Internal Medicine

## 2014-12-27 DIAGNOSIS — H8110 Benign paroxysmal vertigo, unspecified ear: Secondary | ICD-10-CM | POA: Diagnosis not present

## 2014-12-27 DIAGNOSIS — F419 Anxiety disorder, unspecified: Secondary | ICD-10-CM | POA: Diagnosis not present

## 2014-12-27 DIAGNOSIS — G3184 Mild cognitive impairment, so stated: Secondary | ICD-10-CM | POA: Diagnosis not present

## 2014-12-27 DIAGNOSIS — M6281 Muscle weakness (generalized): Secondary | ICD-10-CM | POA: Diagnosis not present

## 2014-12-31 ENCOUNTER — Ambulatory Visit: Payer: Medicare Other | Admitting: Nurse Practitioner

## 2015-01-07 ENCOUNTER — Other Ambulatory Visit: Payer: Self-pay | Admitting: Internal Medicine

## 2015-01-07 ENCOUNTER — Other Ambulatory Visit: Payer: Self-pay | Admitting: Nurse Practitioner

## 2015-02-03 ENCOUNTER — Other Ambulatory Visit: Payer: Self-pay | Admitting: Nurse Practitioner

## 2015-03-04 ENCOUNTER — Other Ambulatory Visit: Payer: Self-pay | Admitting: *Deleted

## 2015-03-04 MED ORDER — OMEPRAZOLE 20 MG PO CPDR: DELAYED_RELEASE_CAPSULE | ORAL | Status: DC

## 2015-03-04 NOTE — Telephone Encounter (Signed)
[facility] 

## 2015-03-30 ENCOUNTER — Other Ambulatory Visit: Payer: Self-pay | Admitting: Nurse Practitioner

## 2015-04-27 ENCOUNTER — Other Ambulatory Visit: Payer: Self-pay | Admitting: Nurse Practitioner

## 2015-06-23 ENCOUNTER — Other Ambulatory Visit: Payer: Self-pay | Admitting: Nurse Practitioner

## 2015-07-08 ENCOUNTER — Ambulatory Visit (INDEPENDENT_AMBULATORY_CARE_PROVIDER_SITE_OTHER): Payer: Medicare Other | Admitting: Nurse Practitioner

## 2015-07-08 VITALS — BP 110/66 | HR 85 | Temp 97.4°F | Resp 20 | Ht 67.0 in | Wt 218.8 lb

## 2015-07-08 DIAGNOSIS — Z23 Encounter for immunization: Secondary | ICD-10-CM | POA: Diagnosis not present

## 2015-07-08 DIAGNOSIS — G4700 Insomnia, unspecified: Secondary | ICD-10-CM | POA: Diagnosis not present

## 2015-07-08 DIAGNOSIS — E039 Hypothyroidism, unspecified: Secondary | ICD-10-CM

## 2015-07-08 DIAGNOSIS — R42 Dizziness and giddiness: Secondary | ICD-10-CM | POA: Diagnosis not present

## 2015-07-08 DIAGNOSIS — K219 Gastro-esophageal reflux disease without esophagitis: Secondary | ICD-10-CM | POA: Diagnosis not present

## 2015-07-08 DIAGNOSIS — F419 Anxiety disorder, unspecified: Secondary | ICD-10-CM

## 2015-07-08 DIAGNOSIS — Z111 Encounter for screening for respiratory tuberculosis: Secondary | ICD-10-CM | POA: Diagnosis not present

## 2015-07-08 DIAGNOSIS — E785 Hyperlipidemia, unspecified: Secondary | ICD-10-CM | POA: Diagnosis not present

## 2015-07-08 DIAGNOSIS — G3184 Mild cognitive impairment, so stated: Secondary | ICD-10-CM

## 2015-07-08 NOTE — Patient Instructions (Signed)
Make appt for TB skin test to be read  Influenza vaccine given today

## 2015-07-08 NOTE — Progress Notes (Signed)
Patient ID: Evelyn Robinson, female   DOB: [birth year], 82 y.o.   MRN: [id number]    PCP: Sharon Seller, NP  Advanced Directive information Does patient have an advance directive?: No, Does patient want to make changes to advanced directive?: Yes - information given  Allergies  Allergen Reactions  . Atorvastatin Other (See Comments)    Other Reaction: Other reaction    Chief Complaint  Patient presents with  . Medical Management of Chronic Issues  . OTHER    Son and Daughter in room with patient     HPI: Patient is a 82 y.o. female seen in the office today for routine follow up on chronic conditions. Pt with hx of anxiety, depression, insomnia, GERD, hypothyroid, mild cognitive impairment . Needing FL2 form filled out. Looking into moving to SNF in [place] with her daughter and sister  Currently living in independent living with assistance with errands, dressing, cleaning, bathing. Daughter does medications. Never got vesicare filled. Denies increase in frequency just incontinence of bladder. Does not feel like she needs this.  Vertigo has improved Insecure when walking, using walker. No falls when walking but rolled out of bed last week. No injury   Would like flu shot. Review of Systems:  Review of Systems  Constitutional: Negative for fever, activity change and appetite change.  HENT: Negative for congestion.   Respiratory: Negative for cough and shortness of breath.   Cardiovascular: Negative for chest pain and leg swelling.  Gastrointestinal: Negative for abdominal pain, constipation and blood in stool.  Genitourinary: Negative for dysuria and difficulty urinating.  Musculoskeletal: Positive for gait problem (using walker).  Skin: Negative for rash.  Neurological: Negative for dizziness, weakness and light-headedness.  Psychiatric/Behavioral: Positive for sleep disturbance (ongoing). The patient is not nervous/anxious.     Past Medical History  Diagnosis Date  .  Anxiety   . Depression   . GERD (gastroesophageal reflux disease)   . Vitamin D deficiency disease   . Hypothyroid   . Urinary retention   . Insomnia   . Mild cognitive impairment   . Gait instability   . Sepsis [facility])    Past Surgical History  Procedure Laterality Date  . Abdominal hysterectomy  1980's   Social History:   reports that she has never smoked. She has never used smokeless tobacco. She reports that she drinks alcohol. She reports that she does not use illicit drugs.  No family history on file.  Medications: Patient's Medications  New Prescriptions   No medications on file  Previous Medications   LEVOTHYROXINE (SYNTHROID, LEVOTHROID) 100 MCG TABLET    TAKE ONE TABLET BY MOUTH ONCE DAILY   MECLIZINE (ANTIVERT) 12.5 MG TABLET    Take 1 tablet (12.5 mg total) by mouth 3 (three) times daily as needed for dizziness.   OMEPRAZOLE (PRILOSEC) 20 MG CAPSULE    TAKE ONE CAPSULE BY MOUTH TWICE DAILY FOR ACID REFLUX.   SIMVASTATIN (ZOCOR) 20 MG TABLET    TAKE ONE TABLET BY MOUTH AT BEDTIME FOR HIGH CHOLESTEROL   TRAZODONE (DESYREL) 50 MG TABLET    TAKE ONE-HALF TO ONE TABLET BY MOUTH AT BEDTIME AS NEEDED FOR SLEEP   VENLAFAXINE XR (EFFEXOR-XR) 37.5 MG 24 HR CAPSULE    TAKE ONE CAPSULE BY MOUTH ONCE DAILY WITH  BREAKFAST  Modified Medications   No medications on file  Discontinued Medications   LEVOTHYROXINE (SYNTHROID, LEVOTHROID) 100 MCG TABLET    TAKE ONE TABLET BY MOUTH ONCE DAILY   SOLIFENACIN (  VESICARE) 5 MG TABLET    Take 1 tablet (5 mg total) by mouth daily.   TRAZODONE (DESYREL) 50 MG TABLET    Take 1 tablet (50 mg total) by mouth at bedtime.     Physical Exam:  Filed Vitals:   07/08/15 1315  BP: 110/66  Pulse: 85  Temp: 97.4 F (36.3 C)  TempSrc: Oral  Resp: 20  Height:  (1.702 m)  Weight: 218 lb 12.8 oz (99.247 kg)  SpO2: 95%   Body mass index is 34.26 kg/(m^2).  Physical Exam  Constitutional: She appears well-developed and well-nourished. No  distress.  HENT:  Mouth/Throat: Oropharynx is clear and moist. No oropharyngeal exudate.  Eyes: Conjunctivae and EOM are normal. Pupils are equal, round, and reactive to light.  Neck: Normal range of motion. Neck supple.  Cardiovascular: Normal rate, regular rhythm and normal heart sounds.   Pulmonary/Chest: Effort normal and breath sounds normal.  Abdominal: Soft. Bowel sounds are normal.  Musculoskeletal: She exhibits no edema or tenderness.  Neurological: She is alert.  Skin: Skin is warm and dry.  Psychiatric: She has a normal mood and affect.    Labs reviewed: Basic Metabolic Panel:  Recent Labs  09/81/19 1623 09/14/14 1817  NA 140 138  K 4.2 3.9  CL 100 106  CO2 25 24  GLUCOSE 117* 145*  BUN 15 24*  CREATININE 0.82 0.80  CALCIUM 9.1 8.7  TSH 1.290  --    Liver Function Tests:  Recent Labs  08/08/14 1623 09/14/14 1817  AST 14 31  ALT 15 28  ALKPHOS 106 84  BILITOT 0.5 0.5  PROT 6.7 6.6  ALBUMIN 4.3 3.9   No results for input(s): LIPASE, AMYLASE in the last 8760 hours. No results for input(s): AMMONIA in the last 8760 hours. CBC:  Recent Labs  08/08/14 1623 09/14/14 1817  WBC 9.8 8.3  NEUTROABS 6.5 5.2  HGB 13.8 12.7  HCT 41.7 39.0  MCV 89 90.5  PLT 343 306   Lipid Panel:  Recent Labs  08/08/14 1623  CHOL 210*  HDL 42  LDLCALC 103*  TRIG 323*  CHOLHDL 5.0*   TSH:  Recent Labs  08/08/14 1623  TSH 1.290   A1C: Lab Results  Component Value Date   HGBA1C 6.1* 11/27/2013     Assessment/Plan 1. Hypothyroidism, unspecified hypothyroidism type -conts on synthroid 100 mcg daily, TSH stable in March 2016, will follow up at this time.  - TSH  2. Gastroesophageal reflux disease without esophagitis -well controlled on omeprazole, will cont current medication  - CBC with Differential  3. Insomnia -stable on trazodone qhs  4. Anxiety Stable, conts on effexor 37.5 mg daily   5. Hyperlipemia -not fasting today, conts on zocor 20  mg daily  - Comprehensive metabolic panel  6. Dizziness -stable, conts on meclizine as needed   7. Mild cognitive impairment -slight worsening of memory over the years, needing assistance with ADLs and medications. Family looking into SNF placement closer to her sister and other daughter. MMSE of 27/30, 2015 was 29/30  8. Need for prophylactic vaccination and inoculation against influenza - Flu Vaccine QUAD 36+ mos IM (Fluarix & Fluzone Quad PF  9. Screening-pulmonary TB - TB Skin Test given  Pt planning on moving to [place] with family- FL2 form completed     Janene Harvey. Biagio Borg  [facility] [phone] 8 am - 5 pm) [phone] (after hours)

## 2015-07-09 ENCOUNTER — Other Ambulatory Visit: Payer: Self-pay | Admitting: *Deleted

## 2015-07-09 DIAGNOSIS — E039 Hypothyroidism, unspecified: Secondary | ICD-10-CM

## 2015-07-09 LAB — CBC WITH DIFFERENTIAL/PLATELET
BASOS ABS: 0 10*3/uL (ref 0.0–0.2)
Basos: 1 %
EOS (ABSOLUTE): 0.3 10*3/uL (ref 0.0–0.4)
Eos: 4 %
HEMOGLOBIN: 13.5 g/dL (ref 11.1–15.9)
Hematocrit: 41.6 % (ref 34.0–46.6)
IMMATURE GRANS (ABS): 0 10*3/uL (ref 0.0–0.1)
Immature Granulocytes: 0 %
LYMPHS ABS: 2.6 10*3/uL (ref 0.7–3.1)
LYMPHS: 32 %
MCH: 29.5 pg (ref 26.6–33.0)
MCHC: 32.5 g/dL (ref 31.5–35.7)
MCV: 91 fL (ref 79–97)
MONOCYTES: 6 %
Monocytes Absolute: 0.5 10*3/uL (ref 0.1–0.9)
NEUTROS PCT: 57 %
Neutrophils Absolute: 4.7 10*3/uL (ref 1.4–7.0)
Platelets: 344 10*3/uL (ref 150–379)
RBC: 4.58 x10E6/uL (ref 3.77–5.28)
RDW: 14.2 % (ref 12.3–15.4)
WBC: 8.1 10*3/uL (ref 3.4–10.8)

## 2015-07-09 LAB — COMPREHENSIVE METABOLIC PANEL
ALBUMIN: 4 g/dL (ref 3.5–4.7)
ALK PHOS: 100 IU/L (ref 39–117)
ALT: 20 IU/L (ref 0–32)
AST: 19 IU/L (ref 0–40)
Albumin/Globulin Ratio: 1.6 (ref 1.1–2.5)
BUN / CREAT RATIO: 19 (ref 11–26)
BUN: 17 mg/dL (ref 8–27)
Bilirubin Total: 0.3 mg/dL (ref 0.0–1.2)
CO2: 26 mmol/L (ref 18–29)
CREATININE: 0.9 mg/dL (ref 0.57–1.00)
Calcium: 9.2 mg/dL (ref 8.7–10.3)
Chloride: 101 mmol/L (ref 96–106)
GFR calc Af Amer: 70 mL/min/{1.73_m2} (ref >59)
GFR calc non Af Amer: 61 mL/min/{1.73_m2} (ref >59)
GLUCOSE: 121 mg/dL — AB (ref 65–99)
Globulin, Total: 2.5 g/dL (ref 1.5–4.5)
Potassium: 4.2 mmol/L (ref 3.5–5.2)
Sodium: 141 mmol/L (ref 134–144)
Total Protein: 6.5 g/dL (ref 6.0–8.5)

## 2015-07-09 LAB — TSH: TSH: 0.173 u[IU]/mL — AB (ref 0.450–4.500)

## 2015-07-09 MED ORDER — LEVOTHYROXINE SODIUM 88 MCG PO TABS: ORAL_TABLET | ORAL | Status: AC

## 2015-07-09 NOTE — Telephone Encounter (Signed)
Medication change refill due to labs per Shanda Bumps

## 2015-07-11 ENCOUNTER — Ambulatory Visit: Payer: Medicare Other

## 2015-07-11 LAB — TB SKIN TEST
Induration: 0 mm
TB Skin Test: NEGATIVE

## 2015-07-16 ENCOUNTER — Other Ambulatory Visit: Payer: Self-pay | Admitting: Nurse Practitioner

## 2015-07-16 ENCOUNTER — Telehealth: Payer: Self-pay | Admitting: *Deleted

## 2015-07-16 NOTE — Telephone Encounter (Signed)
Patient daughter, April called regarding the forms you kept from the CPE appointment on the 14th. Daughter needs the [facility] Form and the TB form. You had told her that you would fill them out and call her when they were ready for pick up. Please Advise.

## 2015-07-17 NOTE — Telephone Encounter (Signed)
Patient daughter, April notified and will check with Aid.

## 2015-07-17 NOTE — Telephone Encounter (Signed)
Per sally forms were given to aid that was at visit when TB skin test was read, forms have been sent for scanning

## 2015-08-27 ENCOUNTER — Other Ambulatory Visit: Payer: Medicare Other

## 2015-09-05 ENCOUNTER — Other Ambulatory Visit: Payer: Self-pay | Admitting: Nurse Practitioner

## 2015-10-07 ENCOUNTER — Other Ambulatory Visit: Payer: Self-pay | Admitting: Nurse Practitioner

## 2015-11-05 ENCOUNTER — Other Ambulatory Visit: Payer: Self-pay

## 2015-11-05 ENCOUNTER — Other Ambulatory Visit: Payer: Self-pay | Admitting: Nurse Practitioner

## 2015-11-05 MED ORDER — TRAZODONE HCL 50 MG PO TABS: ORAL_TABLET | ORAL | Status: DC

## 2015-11-05 NOTE — Telephone Encounter (Signed)
The first Rx was e-prescrib by mistake, (there were two Rx's) printed it the second time. Called [facility] to cancel the first one that was e-prescrib.

## 2015-11-24 ENCOUNTER — Other Ambulatory Visit: Payer: Self-pay | Admitting: Internal Medicine

## 2015-11-24 ENCOUNTER — Other Ambulatory Visit: Payer: Self-pay | Admitting: Nurse Practitioner

## 2015-12-10 ENCOUNTER — Other Ambulatory Visit: Payer: Self-pay | Admitting: Internal Medicine

## 2015-12-10 ENCOUNTER — Other Ambulatory Visit: Payer: Self-pay | Admitting: Nurse Practitioner

## 2016-02-15 ENCOUNTER — Other Ambulatory Visit: Payer: Self-pay | Admitting: Nurse Practitioner

## 2016-03-20 ENCOUNTER — Other Ambulatory Visit: Payer: Self-pay | Admitting: Nurse Practitioner

## 2016-04-07 IMAGING — CT CT HEAD W/O CM
1 series · 16 of 30 positions shown, 20 images · non-contrast
Comparison: None.

CLINICAL DATA: Dizziness, headache

EXAM:
CT HEAD WITHOUT CONTRAST
TECHNIQUE: Contiguous axial images were obtained from the base of the skull
through the vertex without intravenous contrast.

[Series 2: head 5.0 h30s · axial · 0.44mm/px · z∈[-303,-158]mm · 16 of 33 slices shown, 20 images]
[im 2/33  brain]
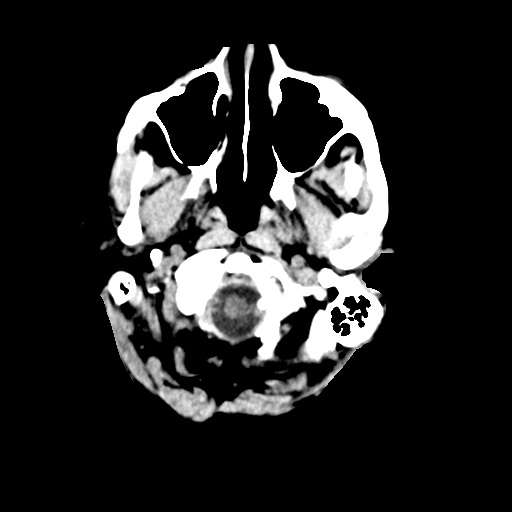
[im 2/33  bone]
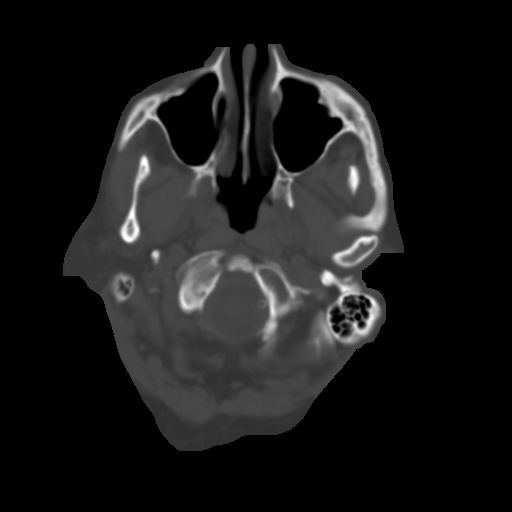
[im 4/33  brain]
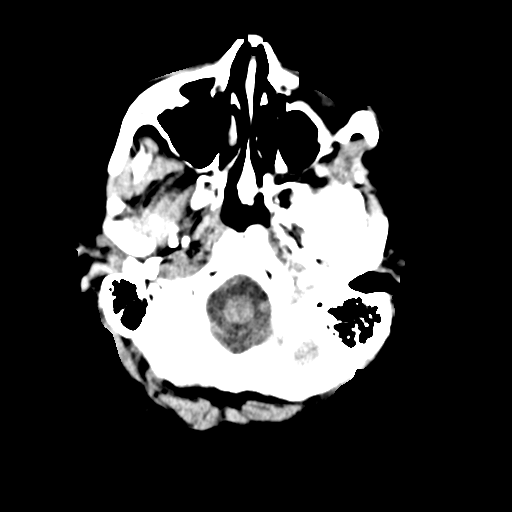
[im 6/33  brain]
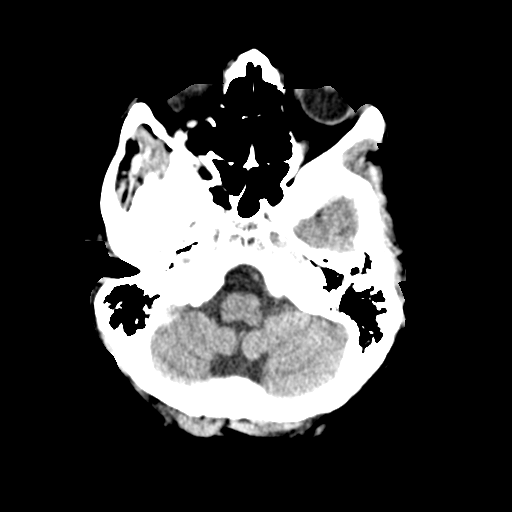
[im 8/33  brain]
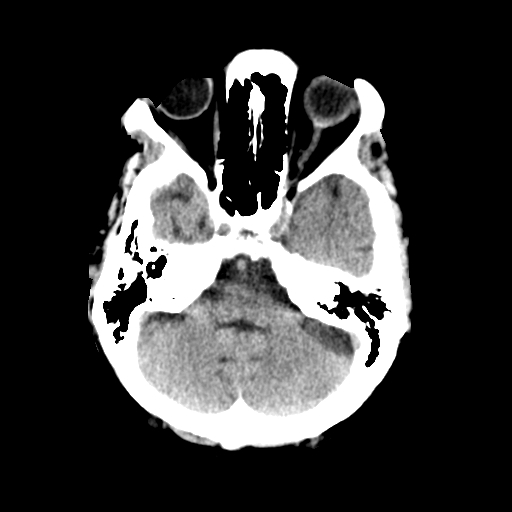
[im 9/33  brain]
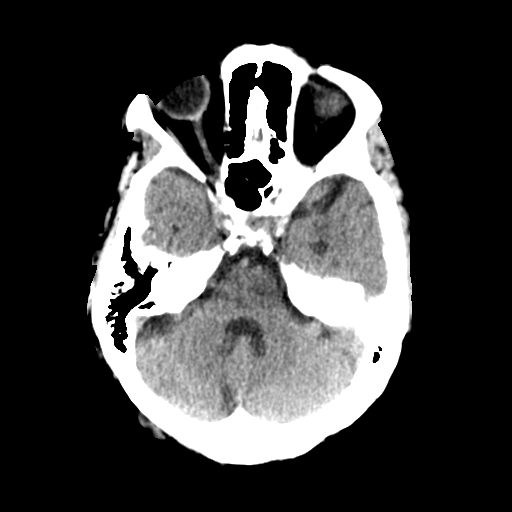
[im 9/33  bone]
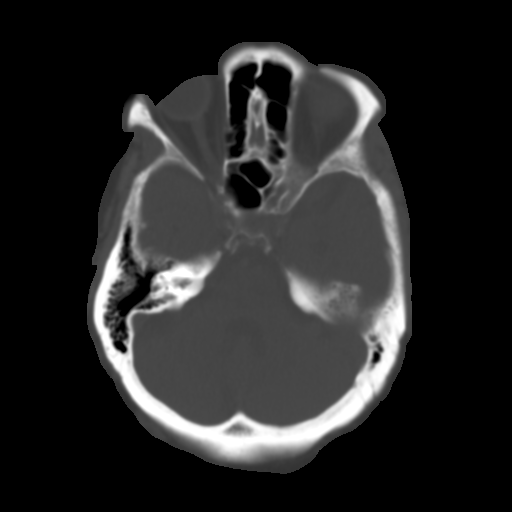
[im 12/33  brain]
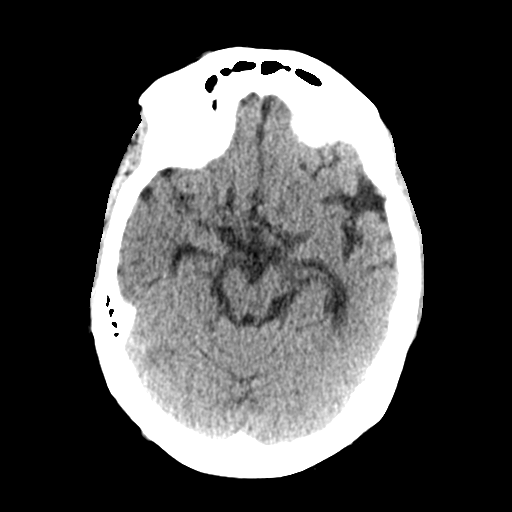
[im 14/33  brain]
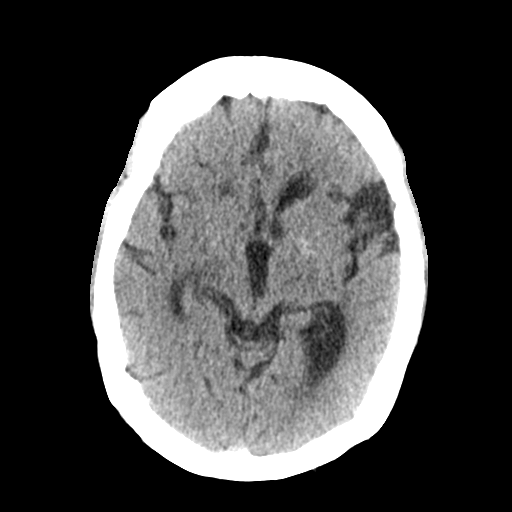
[im 16/33  brain]
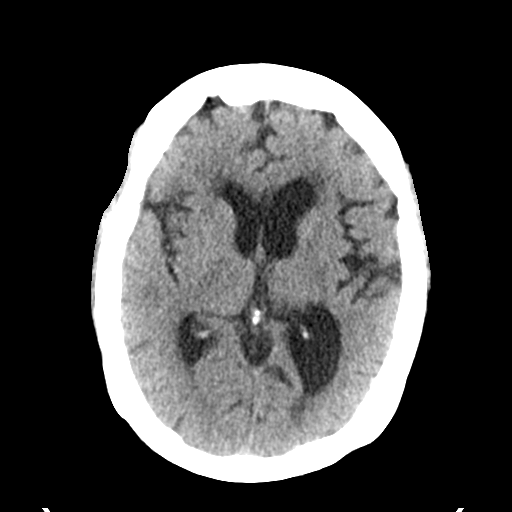
[im 17/33  brain]
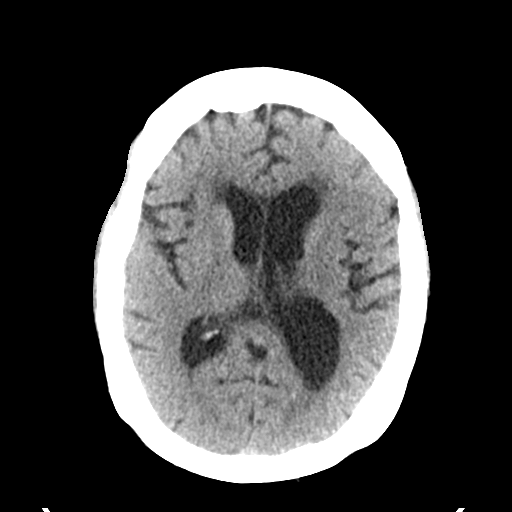
[im 17/33  bone]
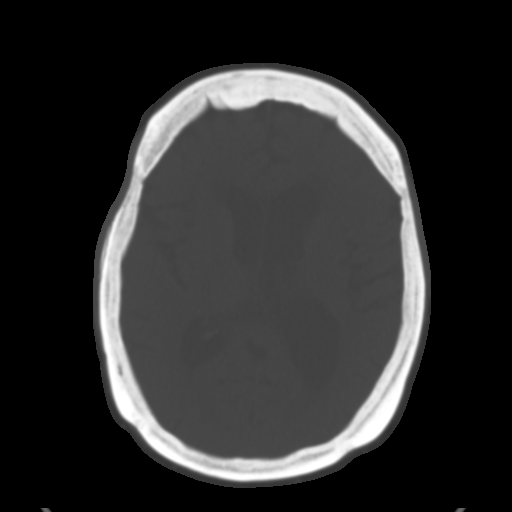
[im 19/33  brain]
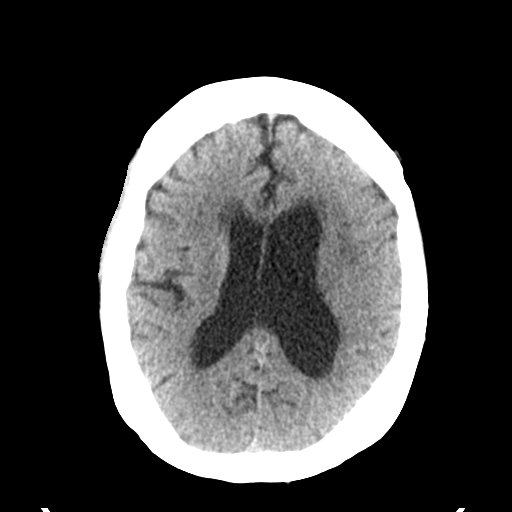
[im 21/33  brain]
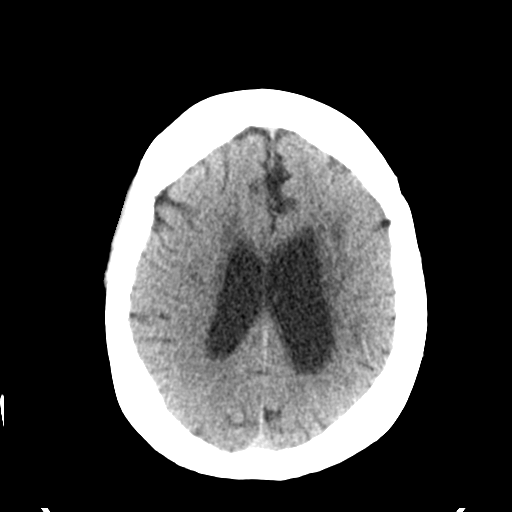
[im 24/33  brain]
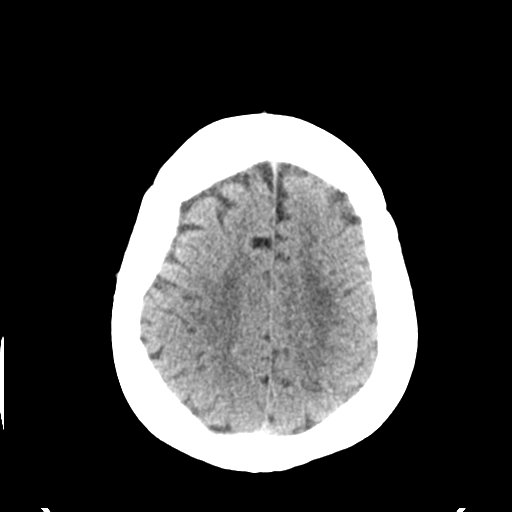
[im 25/33  brain]
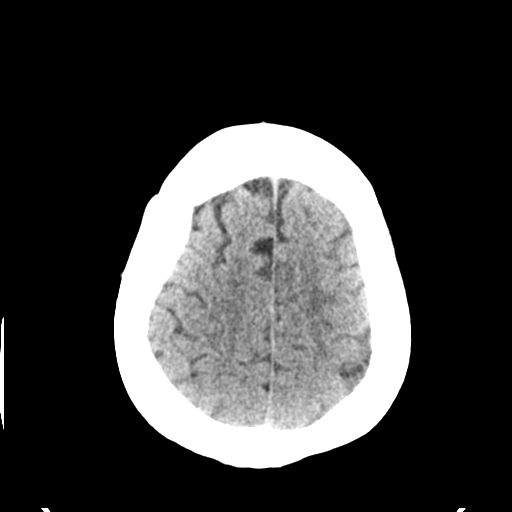
[im 25/33  bone]
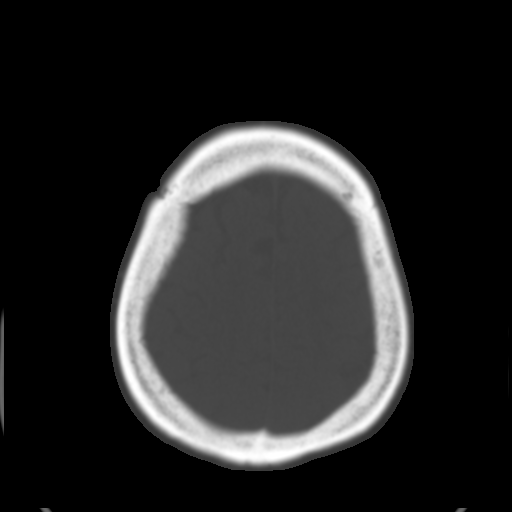
[im 27/33  brain]
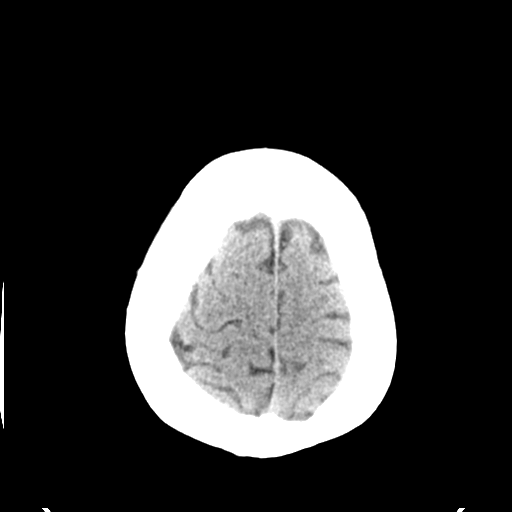
[im 29/33  brain]
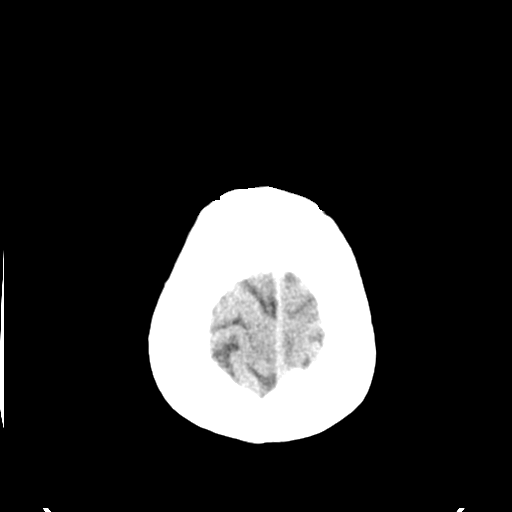
[im 31/33  brain]
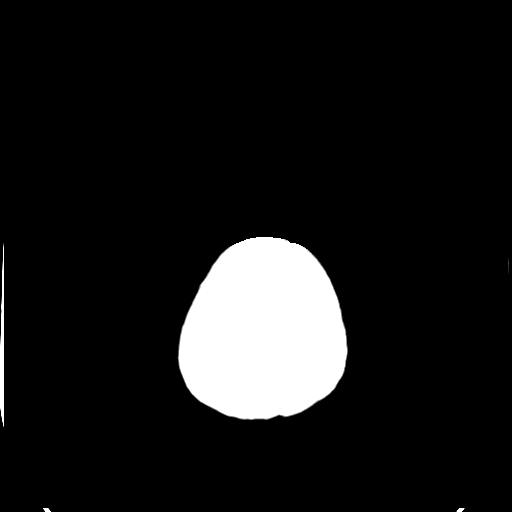

[16 of 30 positions shown; findings below may reference images not displayed]

FINDINGS: Mild diffuse age-related atrophy noted. Scattered and confluent
hypodensity within the periventricular and deep white matter of the
cerebral hemispheres is consistent with chronic small vessel
ischemic disease.

No acute intracranial hemorrhage or large vessel territory infarct
identified. No mass or midline shift. There is no hydrocephalus.
Mild asymmetric prominence of the left lateral ventricle as compared
to the right noted. Basal ganglia calcifications noted. No
extra-axial fluid collection.

The calvarium is intact. Orbits are within normal limits. Scalp soft
tissues are unremarkable.

Paranasal sinuses and mastoid air cells are well pneumatized and
free of fluid.
IMPRESSION: 1. No acute intracranial abnormality.
2. Mild generalized cerebral atrophy with moderate chronic
microvascular ischemic disease.

## 2023-12-23 DEATH — deceased
